# Patient Record
Sex: Female | Born: 1980 | Race: White | Hispanic: No | Marital: Married | Smoking: Never smoker
Health system: Southern US, Community
[De-identification: ages and names within clinical notes are randomized; demographics above are authoritative.]

## PROBLEM LIST (undated history)

## (undated) DIAGNOSIS — B951 Streptococcus, group B, as the cause of diseases classified elsewhere: Secondary | ICD-10-CM

## (undated) DIAGNOSIS — O98819 Other maternal infectious and parasitic diseases complicating pregnancy, unspecified trimester: Secondary | ICD-10-CM

## (undated) DIAGNOSIS — Z789 Other specified health status: Secondary | ICD-10-CM

## (undated) DIAGNOSIS — O24419 Gestational diabetes mellitus in pregnancy, unspecified control: Secondary | ICD-10-CM

## (undated) HISTORY — DX: Other specified health status: Z78.9

## (undated) HISTORY — DX: Gestational diabetes mellitus in pregnancy, unspecified control: O24.419

## (undated) HISTORY — DX: Other maternal infectious and parasitic diseases complicating pregnancy, unspecified trimester: O98.819

## (undated) HISTORY — DX: Streptococcus, group b, as the cause of diseases classified elsewhere: B95.1

---

## 2014-04-23 DIAGNOSIS — R7612 Nonspecific reaction to cell mediated immunity measurement of gamma interferon antigen response without active tuberculosis: Secondary | ICD-10-CM

## 2014-04-23 HISTORY — DX: Nonspecific reaction to cell mediated immunity measurement of gamma interferon antigen response without active tuberculosis: R76.12

## 2015-02-02 ENCOUNTER — Encounter (HOSPITAL_BASED_OUTPATIENT_CLINIC_OR_DEPARTMENT_OTHER): Payer: Self-pay

## 2015-02-02 ENCOUNTER — Encounter (INDEPENDENT_AMBULATORY_CARE_PROVIDER_SITE_OTHER): Payer: Self-pay

## 2015-02-02 LAB — QUANTIFERON(R)-TB GOLD ITM (HLAB): Quantiferon: POSITIVE

## 2015-02-09 ENCOUNTER — Encounter (INDEPENDENT_AMBULATORY_CARE_PROVIDER_SITE_OTHER): Payer: Self-pay | Admitting: Obstetrics & Gynecology

## 2015-02-09 ENCOUNTER — Ambulatory Visit: Payer: Self-pay | Attending: Obstetrics & Gynecology

## 2015-02-21 ENCOUNTER — Encounter (INDEPENDENT_AMBULATORY_CARE_PROVIDER_SITE_OTHER): Payer: Self-pay

## 2015-02-21 DIAGNOSIS — R7612 Nonspecific reaction to cell mediated immunity measurement of gamma interferon antigen response without active tuberculosis: Secondary | ICD-10-CM | POA: Insufficient documentation

## 2015-02-22 ENCOUNTER — Ambulatory Visit (INDEPENDENT_AMBULATORY_CARE_PROVIDER_SITE_OTHER): Payer: Self-pay

## 2015-02-22 ENCOUNTER — Encounter (INDEPENDENT_AMBULATORY_CARE_PROVIDER_SITE_OTHER): Payer: Self-pay

## 2015-02-22 NOTE — Progress Notes (Signed)
Pt seemed unaware needs additional tb testing (3 sputum tests at [facility] for + QFT, +CXR).  Given [facility] tel #; asked her to call for appointment.  Explained needs 3 'cough tests', to check tb further.  Today's intake rescheduled to 11-9; pt too late to be seen this am.

## 2015-03-02 ENCOUNTER — Encounter (INDEPENDENT_AMBULATORY_CARE_PROVIDER_SITE_OTHER): Payer: Self-pay

## 2015-03-02 ENCOUNTER — Ambulatory Visit: Payer: Self-pay | Attending: Obstetrics & Gynecology

## 2015-03-02 VITALS — BP 117/56 | Ht 63.74 in | Wt 169.8 lb

## 2015-03-02 DIAGNOSIS — Z3400 Encounter for supervision of normal first pregnancy, unspecified trimester: Secondary | ICD-10-CM | POA: Insufficient documentation

## 2015-03-02 LAB — CBC
Hematocrit: 38 % (ref 37.0–47.0)
Hgb: 13 g/dL (ref 12.0–16.0)
MCH: 29.8 pg (ref 28.0–32.0)
MCHC: 34.2 g/dL (ref 32.0–36.0)
MCV: 87.2 fL (ref 80.0–100.0)
MPV: 11.5 fL (ref 9.4–12.3)
Nucleated RBC: 0 /100 WBC (ref 0–1)
Platelets: 268 10*3/uL (ref 140–400)
RBC: 4.36 10*6/uL (ref 4.20–5.40)
RDW: 14 % (ref 12–15)
WBC: 6.19 10*3/uL (ref 3.50–10.80)

## 2015-03-02 LAB — HEMOLYSIS INDEX: Hemolysis Index: 1 (ref 0–18)

## 2015-03-02 LAB — HEPATITIS B SURFACE ANTIGEN W/ REFLEX TO CONFIRMATION: Hepatitis B Surface Antigen: NONREACTIVE

## 2015-03-02 LAB — PRENATAL  WORKUP
AB Screen Gel: NEGATIVE
ABO Rh: A POS

## 2015-03-02 LAB — HIV AG/AB 4TH GENERATION: HIV Ag/Ab, 4th Generation: NONREACTIVE

## 2015-03-02 NOTE — Progress Notes (Signed)
Fluent English  Accepts quad.  Sono at 20 wk (12-6) w same day nob.  No flu shot yet; to go again to [facility] 11-10 for sputum specimens.  No zika risk: in [place] > 12 wks ago (returned 12-01-14).  Pregnancy handbook , community resources packet, schedule of classes, female provider availability letter all  given and discussed with  patient.Teaching CHECKLIST addressed  in EPIC.  Breastfeeding FACTS discussed .Social History completed. Marland Kitchen Appointment card details discussed with patient. Patient verbalized understanding of teaching materials.  Last PAP-  0  QFT - +: sputum testing in process.

## 2015-03-03 ENCOUNTER — Encounter (INDEPENDENT_AMBULATORY_CARE_PROVIDER_SITE_OTHER): Payer: Self-pay | Admitting: Obstetrics & Gynecology

## 2015-03-03 LAB — RPR: RPR: NONREACTIVE

## 2015-03-04 LAB — HEMOGLOBINOPATHY EVALUATION W/O HEMOGRAM
Hemoglobin A2: 2.9 % (ref 1.8–3.5)
Hemoglobin A: 97.1 % (ref >96.0)
Hemoglobin F: 0 % (ref <2.0)

## 2015-03-04 LAB — QUAD SCREEN M
Age Risk Down Syndrome: 1:493 {titer}
Estriol, MoM: 0.96
Estriol, Unconjugated: 0.81 ng/mL
Gestational Age:: 16.3
Inhibin MOM: 0.6
Inhibin: 94 pg/mL
MSAFP MOM: 0.78
MSAFP: 27.9 ng/mL
MSS Down Syndrome Risk: <1:5000 {titer}
MSS Trisomy 18 Risk: <1:5000 {titer}
Number of Fetuses:: 1
Risk for ONTD: <1:5000 {titer}
WGHT: 169
hCG MOM: 0.7
hCG: 26

## 2015-03-04 LAB — GONOCOCCUS CULTURE
Chlamydia trachomatis Culture: NEGATIVE
Culture Gonorrhoeae: NEGATIVE

## 2015-03-04 LAB — RUBELLA ANTIBODY, IGG: Rubella AB, IgG: 2.61

## 2015-03-04 LAB — RPR: RPR: NONREACTIVE

## 2015-03-09 ENCOUNTER — Encounter (INDEPENDENT_AMBULATORY_CARE_PROVIDER_SITE_OTHER): Payer: Self-pay | Admitting: Obstetrics & Gynecology

## 2015-03-15 ENCOUNTER — Encounter (INDEPENDENT_AMBULATORY_CARE_PROVIDER_SITE_OTHER): Payer: Self-pay | Admitting: Obstetrics & Gynecology

## 2015-03-29 ENCOUNTER — Other Ambulatory Visit (INDEPENDENT_AMBULATORY_CARE_PROVIDER_SITE_OTHER): Payer: Self-pay

## 2015-03-29 ENCOUNTER — Ambulatory Visit: Payer: Self-pay | Attending: Obstetrics & Gynecology | Admitting: Nurse Practitioner

## 2015-03-29 ENCOUNTER — Encounter (INDEPENDENT_AMBULATORY_CARE_PROVIDER_SITE_OTHER): Payer: Self-pay | Admitting: Nurse Practitioner

## 2015-03-29 ENCOUNTER — Ambulatory Visit
Admission: RE | Admit: 2015-03-29 | Discharge: 2015-03-29 | Disposition: A | Discharge status: Home / Self Care | Payer: Charity | Source: Ambulatory Visit | Attending: Obstetrics & Gynecology | Admitting: Obstetrics & Gynecology

## 2015-03-29 DIAGNOSIS — Z3402 Encounter for supervision of normal first pregnancy, second trimester: Secondary | ICD-10-CM | POA: Insufficient documentation

## 2015-03-29 DIAGNOSIS — Z3400 Encounter for supervision of normal first pregnancy, unspecified trimester: Secondary | ICD-10-CM | POA: Insufficient documentation

## 2015-03-29 DIAGNOSIS — Z3A20 20 weeks gestation of pregnancy: Secondary | ICD-10-CM | POA: Insufficient documentation

## 2015-03-29 LAB — POCT PROTEIN, URINE, QUALITATIVE, DIPSTICK: POCT Protein, UA: NEGATIVE mg/dL

## 2015-03-29 LAB — POCT GLUCOSE, URINE, QUALITATIVE, DIPSTICK: Glucose, UA: NEGATIVE

## 2015-03-29 NOTE — Progress Notes (Signed)
NOB. First visit in this pregnancy.  EDD by certain LMP  QFT+, CXR+, sputa smears neg, cultures pending  Sono - heart views limited, ?EIF, anatomy otherwise wnl. Sono ordered to f/u heart views.  Quad neg  Pap collected  U/c collected  NOB labs wnl including GC/CTM

## 2015-03-31 LAB — PAP SMEAR, SUREPATH WITH HR HPV: HPV DNA, high risk: NOT DETECTED

## 2015-04-08 ENCOUNTER — Other Ambulatory Visit (INDEPENDENT_AMBULATORY_CARE_PROVIDER_SITE_OTHER): Payer: Charity

## 2015-04-08 ENCOUNTER — Ambulatory Visit
Admission: RE | Admit: 2015-04-08 | Discharge: 2015-04-08 | Disposition: A | Discharge status: Home / Self Care | Payer: Charity | Source: Ambulatory Visit | Attending: Nurse Practitioner | Admitting: Nurse Practitioner

## 2015-04-08 DIAGNOSIS — O283 Abnormal ultrasonic finding on antenatal screening of mother: Secondary | ICD-10-CM | POA: Insufficient documentation

## 2015-04-11 ENCOUNTER — Encounter (INDEPENDENT_AMBULATORY_CARE_PROVIDER_SITE_OTHER): Payer: Self-pay | Admitting: Obstetrics & Gynecology

## 2015-04-11 DIAGNOSIS — IMO0002 Reserved for concepts with insufficient information to code with codable children: Secondary | ICD-10-CM | POA: Insufficient documentation

## 2015-04-26 ENCOUNTER — Ambulatory Visit: Payer: Self-pay | Attending: Obstetrics & Gynecology | Admitting: Nurse Practitioner

## 2015-04-26 DIAGNOSIS — Z3400 Encounter for supervision of normal first pregnancy, unspecified trimester: Secondary | ICD-10-CM

## 2015-04-26 LAB — POCT GLUCOSE, URINE, QUALITATIVE, DIPSTICK: Glucose, UA: NEGATIVE

## 2015-04-26 LAB — POCT PROTEIN, URINE, QUALITATIVE, DIPSTICK: POCT Protein, UA: NEGATIVE mg/dL

## 2015-04-26 NOTE — Progress Notes (Signed)
Feeling well, baby active  Sono - isolated EIF  Pap NIL  U/c neg

## 2015-05-24 ENCOUNTER — Ambulatory Visit (INDEPENDENT_AMBULATORY_CARE_PROVIDER_SITE_OTHER): Payer: Charity | Admitting: Nurse Practitioner

## 2015-06-03 ENCOUNTER — Ambulatory Visit: Payer: Medicaid Other | Attending: Obstetrics & Gynecology | Admitting: Nurse Practitioner

## 2015-06-03 VITALS — BP 115/56 | Wt 180.3 lb

## 2015-06-03 DIAGNOSIS — O99810 Abnormal glucose complicating pregnancy: Secondary | ICD-10-CM | POA: Insufficient documentation

## 2015-06-03 DIAGNOSIS — Z3009 Encounter for other general counseling and advice on contraception: Secondary | ICD-10-CM

## 2015-06-03 DIAGNOSIS — Z3400 Encounter for supervision of normal first pregnancy, unspecified trimester: Secondary | ICD-10-CM

## 2015-06-03 LAB — GLUCOSE CHALLENGE: Glucose Challenge: 148 mg/dL

## 2015-06-03 LAB — CBC
Hematocrit: 36.1 % — ABNORMAL LOW (ref 37.0–47.0)
Hgb: 11.7 g/dL — ABNORMAL LOW (ref 12.0–16.0)
MCH: 27.6 pg — ABNORMAL LOW (ref 28.0–32.0)
MCHC: 32.4 g/dL (ref 32.0–36.0)
MCV: 85.1 fL (ref 80.0–100.0)
MPV: 10.8 fL (ref 9.4–12.3)
Nucleated RBC: 0 /100 WBC (ref 0–1)
Platelets: 264 10*3/uL (ref 140–400)
RBC: 4.24 10*6/uL (ref 4.20–5.40)
RDW: 14 % (ref 12–15)
WBC: 7.08 10*3/uL (ref 3.50–10.80)

## 2015-06-03 LAB — POCT PROTEIN, URINE, QUALITATIVE, DIPSTICK: POCT Protein, UA: NEGATIVE mg/dL

## 2015-06-03 LAB — POCT GLUCOSE, URINE, QUALITATIVE, DIPSTICK: Glucose, UA: NEGATIVE

## 2015-06-03 NOTE — Progress Notes (Signed)
Feeling well, baby active  BC - declines  28wk labs today  Sputa cultures neg x3

## 2015-06-07 ENCOUNTER — Telehealth (INDEPENDENT_AMBULATORY_CARE_PROVIDER_SITE_OTHER): Payer: Self-pay

## 2015-06-07 DIAGNOSIS — O99810 Abnormal glucose complicating pregnancy: Secondary | ICD-10-CM

## 2015-06-07 NOTE — Telephone Encounter (Signed)
Accepts 3h for Friday 2-17 at 830; order is in.

## 2015-06-07 NOTE — Telephone Encounter (Signed)
-----   Message from Kimber Relic, RN sent at 06/03/2015  4:01 PM EST -----  3h gtt (needs 3h gtt)

## 2015-06-10 ENCOUNTER — Ambulatory Visit (HOSPITAL_BASED_OUTPATIENT_CLINIC_OR_DEPARTMENT_OTHER): Payer: Medicaid Other

## 2015-06-10 DIAGNOSIS — O99810 Abnormal glucose complicating pregnancy: Secondary | ICD-10-CM

## 2015-06-10 DIAGNOSIS — Z3400 Encounter for supervision of normal first pregnancy, unspecified trimester: Secondary | ICD-10-CM

## 2015-06-10 LAB — GLUCOSE TOLERANCE, 3 HOURS
GTT - 1 hr Specimen: 176 mg/dL
GTT - 2 hr Specimen: 150 mg/dL
GTT - 3 hr Specimen: 139 mg/dL
GTT - Fasting Specimen: 77 mg/dL (ref 70–100)

## 2015-06-10 NOTE — Progress Notes (Signed)
Lab collection completed. Well tolerated by patient.April [name]

## 2015-06-24 ENCOUNTER — Ambulatory Visit (INDEPENDENT_AMBULATORY_CARE_PROVIDER_SITE_OTHER): Payer: Charity | Admitting: Nurse Practitioner

## 2015-06-27 ENCOUNTER — Ambulatory Visit (INDEPENDENT_AMBULATORY_CARE_PROVIDER_SITE_OTHER): Payer: Medicaid Other

## 2015-06-27 ENCOUNTER — Ambulatory Visit: Payer: Medicaid Other | Attending: Obstetrics & Gynecology | Admitting: Nurse Practitioner

## 2015-06-27 DIAGNOSIS — O368190 Decreased fetal movements, unspecified trimester, not applicable or unspecified: Secondary | ICD-10-CM

## 2015-06-27 DIAGNOSIS — Z3400 Encounter for supervision of normal first pregnancy, unspecified trimester: Secondary | ICD-10-CM | POA: Insufficient documentation

## 2015-06-27 LAB — POCT GLUCOSE, URINE, QUALITATIVE, DIPSTICK: Glucose, UA: NEGATIVE

## 2015-06-27 LAB — POCT PROTEIN, URINE, QUALITATIVE, DIPSTICK: POCT Protein, UA: NEGATIVE mg/dL

## 2015-06-27 NOTE — Progress Notes (Signed)
Feeling well, but notes that the baby does not move as much as it used to. For NST today.  Offer Tdap at next visit  28wk labs - 3hr GTT wnl

## 2015-07-11 ENCOUNTER — Ambulatory Visit (INDEPENDENT_AMBULATORY_CARE_PROVIDER_SITE_OTHER): Payer: Medicaid Other | Admitting: Nurse Practitioner

## 2015-07-11 DIAGNOSIS — Z3400 Encounter for supervision of normal first pregnancy, unspecified trimester: Secondary | ICD-10-CM

## 2015-07-11 DIAGNOSIS — Z23 Encounter for immunization: Secondary | ICD-10-CM

## 2015-07-11 LAB — POCT GLUCOSE, URINE, QUALITATIVE, DIPSTICK: Glucose, UA: NEGATIVE

## 2015-07-11 LAB — GROUP B STREP TRANSCRIBED: GBS Transcribed: POSITIVE

## 2015-07-11 LAB — POCT PROTEIN, URINE, QUALITATIVE, DIPSTICK: POCT Protein, UA: NEGATIVE mg/dL

## 2015-07-11 NOTE — Progress Notes (Signed)
Feeling well, baby moving well now  NST was reactive after last visit  Tdap today  GBS collected

## 2015-07-11 NOTE — Progress Notes (Signed)
Administered TDAP, supplied by [facility] at no charge.  Reinforced value of breastmilk in preventing whooping cough.

## 2015-07-14 ENCOUNTER — Encounter (INDEPENDENT_AMBULATORY_CARE_PROVIDER_SITE_OTHER): Payer: Self-pay

## 2015-07-14 ENCOUNTER — Encounter (INDEPENDENT_AMBULATORY_CARE_PROVIDER_SITE_OTHER): Payer: Self-pay | Admitting: Obstetrics & Gynecology

## 2015-07-14 DIAGNOSIS — O99820 Streptococcus B carrier state complicating pregnancy: Secondary | ICD-10-CM | POA: Insufficient documentation

## 2015-07-20 ENCOUNTER — Ambulatory Visit (INDEPENDENT_AMBULATORY_CARE_PROVIDER_SITE_OTHER): Payer: Medicaid Other | Admitting: Nurse Practitioner

## 2015-07-20 DIAGNOSIS — Z3400 Encounter for supervision of normal first pregnancy, unspecified trimester: Secondary | ICD-10-CM

## 2015-07-20 LAB — POCT PROTEIN, URINE, QUALITATIVE, DIPSTICK: POCT Protein, UA: NEGATIVE mg/dL

## 2015-07-20 LAB — POCT GLUCOSE, URINE, QUALITATIVE, DIPSTICK: Glucose, UA: NEGATIVE

## 2015-07-20 NOTE — Progress Notes (Signed)
Feeling well, baby active  GBS+: will treat in labor  Reviewed when to seek care, signs of labor, leakage of fluid, headache, decreased fetal movement

## 2015-07-20 NOTE — Progress Notes (Signed)
Patient seen with Darleen Crocker, NP student. Agree with her documentation below.

## 2015-07-28 ENCOUNTER — Ambulatory Visit: Payer: Self-pay | Attending: Obstetrics & Gynecology | Admitting: Family

## 2015-07-28 ENCOUNTER — Encounter (INDEPENDENT_AMBULATORY_CARE_PROVIDER_SITE_OTHER): Payer: Self-pay | Admitting: Obstetrics & Gynecology

## 2015-07-28 VITALS — BP 119/61 | HR 79 | Wt 193.0 lb

## 2015-07-28 DIAGNOSIS — Z3009 Encounter for other general counseling and advice on contraception: Secondary | ICD-10-CM

## 2015-07-28 DIAGNOSIS — Z3400 Encounter for supervision of normal first pregnancy, unspecified trimester: Secondary | ICD-10-CM

## 2015-07-28 LAB — POCT GLUCOSE, URINE, QUALITATIVE, DIPSTICK: Glucose, UA: NEGATIVE

## 2015-07-28 LAB — POCT PROTEIN, URINE, QUALITATIVE, DIPSTICK: POCT Protein, UA: NEGATIVE mg/dL

## 2015-07-28 NOTE — Progress Notes (Signed)
No major complaints   already has Medicaid, she says

## 2015-08-05 ENCOUNTER — Ambulatory Visit (INDEPENDENT_AMBULATORY_CARE_PROVIDER_SITE_OTHER): Payer: Self-pay | Admitting: Family

## 2015-08-05 VITALS — BP 120/61 | HR 76 | Wt 192.8 lb

## 2015-08-05 DIAGNOSIS — R7612 Nonspecific reaction to cell mediated immunity measurement of gamma interferon antigen response without active tuberculosis: Secondary | ICD-10-CM

## 2015-08-05 DIAGNOSIS — Z3400 Encounter for supervision of normal first pregnancy, unspecified trimester: Secondary | ICD-10-CM

## 2015-08-05 DIAGNOSIS — Z3009 Encounter for other general counseling and advice on contraception: Secondary | ICD-10-CM

## 2015-08-05 DIAGNOSIS — O99820 Streptococcus B carrier state complicating pregnancy: Secondary | ICD-10-CM

## 2015-08-05 LAB — POCT GLUCOSE, URINE, QUALITATIVE, DIPSTICK: Glucose, UA: NEGATIVE

## 2015-08-05 LAB — POCT PROTEIN, URINE, QUALITATIVE, DIPSTICK: POCT Protein, UA: NEGATIVE mg/dL

## 2015-08-05 NOTE — Progress Notes (Signed)
Feeling well, fetus active  GBS +- treat in labor  Reviewed warning sx, urgent care, PTL, PIH, labor sx, kick counts

## 2015-08-12 ENCOUNTER — Ambulatory Visit (INDEPENDENT_AMBULATORY_CARE_PROVIDER_SITE_OTHER): Payer: Self-pay | Admitting: Nurse Practitioner

## 2015-08-12 DIAGNOSIS — Z3400 Encounter for supervision of normal first pregnancy, unspecified trimester: Secondary | ICD-10-CM

## 2015-08-12 LAB — POCT PROTEIN, URINE, QUALITATIVE, DIPSTICK: POCT Protein, UA: NEGATIVE mg/dL

## 2015-08-12 LAB — POCT GLUCOSE, URINE, QUALITATIVE, DIPSTICK: Glucose, UA: NEGATIVE

## 2015-08-12 NOTE — Progress Notes (Signed)
Feeling well, baby active  Reviewed when to call for sx labor, PIH, urgent care, decreased fetal movement.

## 2015-08-19 ENCOUNTER — Ambulatory Visit (INDEPENDENT_AMBULATORY_CARE_PROVIDER_SITE_OTHER): Payer: Charity | Admitting: Obstetrics

## 2015-08-23 ENCOUNTER — Inpatient Hospital Stay (HOSPITAL_BASED_OUTPATIENT_CLINIC_OR_DEPARTMENT_OTHER): Payer: Medicaid Other

## 2015-08-23 ENCOUNTER — Observation Stay (HOSPITAL_BASED_OUTPATIENT_CLINIC_OR_DEPARTMENT_OTHER): Payer: Medicaid Other | Admitting: Anesthesiology

## 2015-08-23 ENCOUNTER — Inpatient Hospital Stay
Admission: AD | Admit: 2015-08-23 | Discharge: 2015-08-27 | DRG: 540 | Disposition: A | Discharge status: Home / Self Care | Payer: Medicaid Other | Source: Ambulatory Visit | Attending: Obstetrics & Gynecology | Admitting: Obstetrics & Gynecology

## 2015-08-23 ENCOUNTER — Ambulatory Visit: Payer: Self-pay | Attending: Obstetrics & Gynecology | Admitting: Obstetrics

## 2015-08-23 ENCOUNTER — Encounter (HOSPITAL_BASED_OUTPATIENT_CLINIC_OR_DEPARTMENT_OTHER): Payer: Self-pay

## 2015-08-23 DIAGNOSIS — Z3A41 41 weeks gestation of pregnancy: Secondary | ICD-10-CM

## 2015-08-23 DIAGNOSIS — D259 Leiomyoma of uterus, unspecified: Secondary | ICD-10-CM | POA: Diagnosis present

## 2015-08-23 DIAGNOSIS — Z3400 Encounter for supervision of normal first pregnancy, unspecified trimester: Secondary | ICD-10-CM

## 2015-08-23 DIAGNOSIS — Z3490 Encounter for supervision of normal pregnancy, unspecified, unspecified trimester: Secondary | ICD-10-CM

## 2015-08-23 DIAGNOSIS — O99824 Streptococcus B carrier state complicating childbirth: Secondary | ICD-10-CM | POA: Diagnosis present

## 2015-08-23 DIAGNOSIS — O134 Gestational [pregnancy-induced] hypertension without significant proteinuria, complicating childbirth: Secondary | ICD-10-CM | POA: Diagnosis present

## 2015-08-23 DIAGNOSIS — O324XX0 Maternal care for high head at term, not applicable or unspecified: Principal | ICD-10-CM | POA: Diagnosis present

## 2015-08-23 DIAGNOSIS — O3413 Maternal care for benign tumor of corpus uteri, third trimester: Secondary | ICD-10-CM | POA: Diagnosis present

## 2015-08-23 LAB — CBC
Hematocrit: 37.6 % (ref 37.0–47.0)
Hgb: 12.5 g/dL (ref 12.0–16.0)
MCH: 27.6 pg — ABNORMAL LOW (ref 28.0–32.0)
MCHC: 33.2 g/dL (ref 32.0–36.0)
MCV: 83 fL (ref 80.0–100.0)
MPV: 11.4 fL (ref 9.4–12.3)
Nucleated RBC: 0 /100 WBC (ref 0–1)
Platelets: 261 10*3/uL (ref 140–400)
RBC: 4.53 10*6/uL (ref 4.20–5.40)
RDW: 16 % — ABNORMAL HIGH (ref 12–15)
WBC: 7.69 10*3/uL (ref 3.50–10.80)

## 2015-08-23 LAB — GFR: EGFR: >60

## 2015-08-23 LAB — TYPE AND SCREEN
AB Screen Gel: NEGATIVE
ABO Rh: A POS

## 2015-08-23 LAB — URIC ACID: Uric acid: 2.9 mg/dL (ref 2.6–6.0)

## 2015-08-23 LAB — CREATININE, SERUM: Creatinine: 0.6 mg/dL (ref 0.6–1.0)

## 2015-08-23 LAB — AST: AST (SGOT): 11 U/L (ref 5–34)

## 2015-08-23 LAB — POCT PROTEIN, URINE, QUALITATIVE, DIPSTICK

## 2015-08-23 LAB — POCT GLUCOSE, URINE, QUALITATIVE, DIPSTICK: Glucose, UA: NEGATIVE

## 2015-08-23 LAB — LACTATE DEHYDROGENASE: LDH: 170 U/L (ref 125–331)

## 2015-08-23 LAB — ALT: ALT: 13 U/L (ref 0–55)

## 2015-08-23 MED ORDER — FENTANYL 2 MCG/ML+BUPIVACAINE 0.125% 100 ML
EPIDURAL | Status: DC
Start: 2015-08-23 — End: 2015-08-25
  Administered 2015-08-24 (×4): 100 mL via EPIDURAL
  Filled 2015-08-23 (×3): qty 100

## 2015-08-23 MED ORDER — EPHEDRINE SULFATE 50 MG/ML IJ SOLN
10.0000 mg | Freq: Once | INTRAMUSCULAR | Status: DC | PRN
Start: 2015-08-23 — End: 2015-08-24

## 2015-08-23 MED ORDER — FAMOTIDINE 20 MG PO TABS
20.0000 mg | ORAL_TABLET | Freq: Two times a day (BID) | ORAL | Status: DC | PRN
Start: 2015-08-23 — End: 2015-08-24

## 2015-08-23 MED ORDER — LACTATED RINGERS IV BOLUS
1000.0000 mL | Freq: Once | INTRAVENOUS | Status: DC
Start: 2015-08-23 — End: 2015-08-24

## 2015-08-23 MED ORDER — TERBUTALINE SULFATE 1 MG/ML IJ SOLN
0.2500 mg | Freq: Once | INTRAMUSCULAR | Status: DC | PRN
Start: 2015-08-23 — End: 2015-08-24

## 2015-08-23 MED ORDER — BUPIVACAINE HCL (PF) 0.25 % IJ SOLN
INTRAMUSCULAR | Status: AC
Start: 2015-08-23 — End: 2015-08-23
  Administered 2015-08-23: 20 mL
  Filled 2015-08-23: qty 20

## 2015-08-23 MED ORDER — LACTATED RINGERS IV SOLN
INTRAVENOUS | Status: DC
Start: 2015-08-23 — End: 2015-08-25
  Administered 2015-08-24: 200 mL via INTRAVENOUS

## 2015-08-23 MED ORDER — OXYTOCIN-LACTATED RINGERS 30 UNIT/500ML IV SOLN
2.0000 m[IU]/min | INTRAVENOUS | Status: DC
Start: 2015-08-23 — End: 2015-08-25

## 2015-08-23 MED ORDER — PENICILLIN 5 MU IN NORMAL SALINE 100 ML (CNR)
5.0000 10*6.[IU] | Freq: Once | Status: AC
Start: 2015-08-23 — End: 2015-08-23
  Administered 2015-08-23: 5 10*6.[IU] via INTRAVENOUS
  Filled 2015-08-23: qty 100

## 2015-08-23 MED ORDER — ONDANSETRON 8 MG PO TBDP
8.0000 mg | ORAL_TABLET | Freq: Three times a day (TID) | ORAL | Status: DC | PRN
Start: 2015-08-23 — End: 2015-08-24

## 2015-08-23 MED ORDER — PENICILLIN 2.5 MU IN NORMAL SALINE 100 ML (CNR)
2.5000 10*6.[IU] | Status: DC
Start: 2015-08-24 — End: 2015-08-24
  Administered 2015-08-23 – 2015-08-24 (×5): 2.5 10*6.[IU] via INTRAVENOUS
  Filled 2015-08-23 (×5): qty 100

## 2015-08-23 MED ORDER — FAMOTIDINE 10 MG/ML IV SOLN (WRAP)
20.0000 mg | Freq: Two times a day (BID) | INTRAVENOUS | Status: DC | PRN
Start: 2015-08-23 — End: 2015-08-24

## 2015-08-23 MED ORDER — CITRIC ACID-SODIUM CITRATE 334-500 MG/5ML PO SOLN
30.0000 mL | Freq: Once | ORAL | Status: DC | PRN
Start: 2015-08-23 — End: 2015-08-24

## 2015-08-23 MED ORDER — OXYTOCIN-LACTATED RINGERS 30 UNIT/500ML IV SOLN
2.0000 m[IU]/min | INTRAVENOUS | Status: DC
Start: 2015-08-23 — End: 2015-08-25
  Administered 2015-08-24: 6 m[IU]/min via INTRAVENOUS
  Filled 2015-08-23: qty 500

## 2015-08-23 MED ORDER — NALOXONE HCL 0.4 MG/ML IJ SOLN (WRAP)
0.1000 mg | INTRAMUSCULAR | Status: DC | PRN
Start: 2015-08-23 — End: 2015-08-24

## 2015-08-23 MED ORDER — OXYTOCIN-LACTATED RINGERS 30 UNIT/500ML IV SOLN
INTRAVENOUS | Status: AC
Start: 2015-08-23 — End: 2015-08-23
  Administered 2015-08-23: 2 m[IU]/min via INTRAVENOUS
  Filled 2015-08-23: qty 500

## 2015-08-23 MED ORDER — MISOPROSTOL 200 MCG PO TABS
50.0000 ug | ORAL_TABLET | ORAL | Status: DC | PRN
Start: 2015-08-23 — End: 2015-08-27
  Filled 2015-08-23: qty 1

## 2015-08-23 MED ORDER — SODIUM CHLORIDE 0.9 % IV SOLN
INTRAVENOUS | Status: DC | PRN
Start: 2015-08-23 — End: 2015-08-24
  Administered 2015-08-23: 14 mL via EPIDURAL

## 2015-08-23 MED ORDER — ONDANSETRON HCL 4 MG/2ML IJ SOLN
4.0000 mg | Freq: Three times a day (TID) | INTRAMUSCULAR | Status: DC | PRN
Start: 2015-08-23 — End: 2015-08-24

## 2015-08-23 MED ORDER — FENTANYL 2 MCG/ML+BUPIVACAINE 0.125% 100 ML
EPIDURAL | Status: AC
Start: 2015-08-23 — End: 2015-08-24
  Administered 2015-08-23: 10 mL/h via EPIDURAL
  Filled 2015-08-23: qty 100

## 2015-08-23 MED ORDER — METOCLOPRAMIDE HCL 5 MG/ML IJ SOLN
10.0000 mg | Freq: Once | INTRAMUSCULAR | Status: DC | PRN
Start: 2015-08-23 — End: 2015-08-24

## 2015-08-23 MED ORDER — FENTANYL CITRATE (PF) 50 MCG/ML IJ SOLN (WRAP)
INTRAMUSCULAR | Status: AC
Start: 2015-08-23 — End: 2015-08-23
  Administered 2015-08-23: 100 ug
  Filled 2015-08-23: qty 2

## 2015-08-23 NOTE — Anesthesia Preprocedure Evaluation (Signed)
Anesthesia Evaluation    AIRWAY    Mallampati: II    TM distance: >3 FB  Neck ROM: full  Mouth Opening:full   CARDIOVASCULAR    cardiovascular exam normal       DENTAL    no notable dental hx     PULMONARY    pulmonary exam normal     OTHER FINDINGS                      Anesthesia Plan    ASA 2     epidural                     Detailed anesthesia plan: epidural            informed consent obtained      pertinent labs reviewed

## 2015-08-23 NOTE — H&P (Signed)
H&P note    Subj: Pt is a 35 y.o. G1P0 at [redacted]w[redacted]d, EDD 08/15/2015, by Last Menstrual Period c/w 20 week sono presenting for IOL for labor for term.   Pt had one elevated BP in clinic today.  Denise ha, visual changes, ruq pain.  Feeling some ctx  Denies lof, VB    Complications of pregnancy:  GBS pos  Elevated 1 hr, normal 3 hr gtt      OB History   Gravida Para Term Preterm AB SAB TAB Ectopic Multiple Living   1               # Outcome Date GA Lbr Len/2nd Weight Sex Delivery Anes PTL Lv   1 Current                   PMH:   Past Medical History   Diagnosis Date   . 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  Tel # given 02-22-15. dpr   . Group B streptococcal infection in pregnancy      PSH: History reviewed. No pertinent past surgical history.  SHx: neg x3  FHx: History reviewed. No pertinent family history.  Meds:  Prescriptions prior to admission   Medication Sig   . Prenatal Vit-Fe Fumarate-FA (PRENATAL LOW IRON) 27-0.8 MG Tab Take 1 tablet by mouth daily.     All:No Known Allergies      Objective  PE:   Filed Vitals:    08/23/15 1805   BP: 133/84       Gen: NAD  Chest: RRR, breathing comfortably  Abd: s/nt/gravid   Ext: no edema, nttp  VE: 3/80/-2     Vtx, EFW 8 lb by leopolds    FHT: 130/ mod var/ + accels , no decels  TOCO: 7-8 min    PNL:  GBS+   Lab Results   Component Value Date    ABORH A POS 03/02/2015    HEPBSAG Non-Reactive 03/02/2015    GBS Positive 07/11/2015    RPR Nonreactive 03/04/2015    RUBELLAABIGG 2.61 03/02/2015    CTRACHOMATCX Negative 03/04/2015     Lab Results   Component Value Date    WBC 7.08 06/03/2015    HGB 11.7* 06/03/2015    HCT 36.1* 06/03/2015    MCV 85.1 06/03/2015    PLT 264 06/03/2015           Assessment/Plan: Pt is a 35 y.o. G1P0 at [redacted]w[redacted]d EDD 08/15/2015, by Last Menstrual Period  A POS/Rubella Immune/ GBS positive  - FHR cat I  - Admit for indiction  - PIH labs  - Pit for induction  - PCN   - D/w Dr Evette Cristal      Linna Hoff, MD  PGY4

## 2015-08-23 NOTE — Progress Notes (Signed)
Received patient at 1810, VSS and FHR Category I. Hemorrhage risk low. Plan of care explained and all questions answered. Contacted resident and waiting for response.

## 2015-08-23 NOTE — Progress Notes (Addendum)
Labor Check     Pt tolerating contractions well.     Filed Vitals:    08/23/15 2000 08/23/15 2030 08/23/15 2100 08/23/15 2130   BP: 122/73 121/61 123/70 124/71   Pulse: 72 67 68 66   Temp:       TempSrc:       Resp:         EFM: 130, moderate, + accels, no decels  Toco: ctx q20min  SVE: 4/100/-2 AROM, thick meconium at 2245    A/P: 35 y.o. G1P0 @ [redacted]w[redacted]d, in labor  Rh+/RI/GBS +  EFM category 1  Continue current mgmt  Anticipate NSVD    Prince Solian, PGY2

## 2015-08-23 NOTE — Progress Notes (Signed)
Patient send to L&D for induction due to post dates and [facility]  Communication sheet, and instruction handed to pt.  Pt will provide own transportation and mentioned going home to [place] to pick up some belongings before going to the hospital.  Triage charge nurse Jae Dire notified and aware of pt's arrival @ 3:55 pm  Mariana Arn

## 2015-08-23 NOTE — Anesthesia Procedure Notes (Signed)
Epidural  Patient location during procedure: L&D  Reason for block: Labor or C-section  Block at Surgeon's request: Yes      Staffing  Anesthesiologist: Karmen Bongo DAVID  Performed by: anesthesiologist     Pre-procedure Checklist   Completed: patient identified, pre-op evaluation, timeout performed, risks and benefits discussed and anesthesia consent given      Epidural  Patient monitoring: NIBP and Pulse oximetry    Premedication: No and Meaningful Contact Maintained  Patient position: sitting    Sterile Technique: Betadine, Sterile gloves, Mask and Sterile drape  Skin Local: bupivicaine 0.25%  Dose: 1 mL    Attempts  Number of attempts: 1                    Successful attempt  Interspace: L2-3  Approach: midline    Needle type: Touhy needle   Needle gauge: 17  Injection technique: LOR air  Epidural Space ID: 5 cm  CSF Return: No   Blood Return: No  Paresthesia Pain: no and No    Needle Placement  Needle type: Touhy needle   Needle gauge: 17  Injection technique: LOR air  CSF Return: No  Blood Return: No          Paresthesia Pain: no and No    Catheter Placement   Catheter type: end hole  Catheter size: 19 G  Catheter at skin depth: 10 cm  CSF Return: No  Blood Return: No  Test Dose:negative    Incremental injection: yes    No Catheter IV/SA Signs or Symptoms    Assessment   Patient tolerated procedure well: Yes  Block Outcome: patient tolerated procedure well, no complications and pain improved

## 2015-08-23 NOTE — Progress Notes (Signed)
Pt well, missed appt 4/28 bc flat tire  New elevated BP today, denies PEC sx  Send to [facility] for IOL

## 2015-08-24 ENCOUNTER — Encounter (HOSPITAL_BASED_OUTPATIENT_CLINIC_OR_DEPARTMENT_OTHER): Payer: Self-pay

## 2015-08-24 ENCOUNTER — Encounter (HOSPITAL_BASED_OUTPATIENT_CLINIC_OR_DEPARTMENT_OTHER): Admission: AD | Disposition: A | Discharge status: Home / Self Care | Payer: Self-pay | Source: Ambulatory Visit

## 2015-08-24 DIAGNOSIS — O480 Post-term pregnancy: Secondary | ICD-10-CM

## 2015-08-24 DIAGNOSIS — Z3A41 41 weeks gestation of pregnancy: Secondary | ICD-10-CM

## 2015-08-24 SURGERY — Surgical Case
Anesthesia: Regional | Site: Abdomen | Wound class: Clean Contaminated

## 2015-08-24 MED ORDER — HYDROMORPHONE HCL 1 MG/ML IJ SOLN
0.5000 mg | INTRAMUSCULAR | Status: DC | PRN
Start: 2015-08-24 — End: 2015-08-27

## 2015-08-24 MED ORDER — LANOLIN EX OINT
TOPICAL_OINTMENT | CUTANEOUS | Status: DC | PRN
Start: 2015-08-24 — End: 2015-08-27

## 2015-08-24 MED ORDER — OXYTOCIN 10 UNIT/ML IJ SOLN
INTRAMUSCULAR | Status: AC
Start: 2015-08-24 — End: ?
  Filled 2015-08-24: qty 4

## 2015-08-24 MED ORDER — ONDANSETRON HCL 4 MG/2ML IJ SOLN
INTRAMUSCULAR | Status: DC | PRN
Start: 2015-08-24 — End: 2015-08-24
  Administered 2015-08-24: 4 mg via INTRAVENOUS

## 2015-08-24 MED ORDER — KETOROLAC TROMETHAMINE 30 MG/ML IJ SOLN
30.0000 mg | Freq: Once | INTRAMUSCULAR | Status: AC | PRN
Start: 2015-08-24 — End: 2015-08-25

## 2015-08-24 MED ORDER — ACETAMINOPHEN 500 MG PO TABS
1000.0000 mg | ORAL_TABLET | Freq: Four times a day (QID) | ORAL | Status: DC | PRN
Start: 2015-08-24 — End: 2015-08-25

## 2015-08-24 MED ORDER — DEXTROSE-SODIUM CHLORIDE 5-0.9 % IV SOLN
INTRAVENOUS | Status: AC
Start: 2015-08-24 — End: 2015-08-24

## 2015-08-24 MED ORDER — OXYCODONE-ACETAMINOPHEN 5-325 MG PO TABS
ORAL_TABLET | ORAL | Status: AC
Start: 2015-08-24 — End: ?
  Filled 2015-08-24: qty 1

## 2015-08-24 MED ORDER — ONDANSETRON HCL 4 MG/2ML IJ SOLN
4.0000 mg | Freq: Four times a day (QID) | INTRAMUSCULAR | Status: DC | PRN
Start: 2015-08-24 — End: 2015-08-24

## 2015-08-24 MED ORDER — CEFAZOLIN SODIUM-DEXTROSE 2-3 GM-% IV SOLR
2.0000 g | Freq: Once | INTRAVENOUS | Status: AC
Start: 2015-08-24 — End: 2015-08-24
  Administered 2015-08-24: 2 g via INTRAVENOUS

## 2015-08-24 MED ORDER — PHENYLEPHRINE 100 MCG/ML IN NACL 0.9% IV SOSY
PREFILLED_SYRINGE | INTRAVENOUS | Status: AC
Start: 2015-08-24 — End: ?
  Filled 2015-08-24: qty 5

## 2015-08-24 MED ORDER — DOCUSATE SODIUM 100 MG PO CAPS
200.0000 mg | ORAL_CAPSULE | Freq: Two times a day (BID) | ORAL | Status: DC | PRN
Start: 2015-08-24 — End: 2015-08-27
  Administered 2015-08-25 – 2015-08-27 (×5): 200 mg via ORAL
  Filled 2015-08-24 (×5): qty 2

## 2015-08-24 MED ORDER — LACTATED RINGERS IV SOLN
INTRAVENOUS | Status: DC
Start: 2015-08-24 — End: 2015-08-25

## 2015-08-24 MED ORDER — OXYTOCIN 10 UNIT/ML IJ SOLN
INTRAMUSCULAR | Status: DC | PRN
Start: 2015-08-24 — End: 2015-08-24
  Administered 2015-08-24 (×2): 20 [IU] via INTRAVENOUS

## 2015-08-24 MED ORDER — LACTATED RINGERS IV BOLUS
1000.0000 mL | Freq: Once | INTRAVENOUS | Status: DC
Start: 2015-08-24 — End: 2015-08-24

## 2015-08-24 MED ORDER — TETANUS-DIPHTH-ACELL PERTUSSIS 5-2.5-18.5 LF-MCG/0.5 IM SUSP
0.5000 mL | INTRAMUSCULAR | Status: DC | PRN
Start: 2015-08-24 — End: 2015-08-25

## 2015-08-24 MED ORDER — NALBUPHINE HCL 10 MG/ML IJ SOLN
5.0000 mg | INTRAMUSCULAR | Status: DC | PRN
Start: 2015-08-24 — End: 2015-08-27

## 2015-08-24 MED ORDER — METHYLERGONOVINE MALEATE 0.2 MG/ML IJ SOLN
200.0000 ug | INTRAMUSCULAR | Status: DC | PRN
Start: 2015-08-24 — End: 2015-08-27

## 2015-08-24 MED ORDER — OXYTOCIN-LACTATED RINGERS 30 UNIT/500ML IV SOLN
INTRAVENOUS | Status: AC
Start: 2015-08-24 — End: ?
  Filled 2015-08-24: qty 500

## 2015-08-24 MED ORDER — LIDOCAINE-EPINEPHRINE 2 %-1:200000 IJ SOLN
INTRAMUSCULAR | Status: DC | PRN
Start: 2015-08-24 — End: 2015-08-24
  Administered 2015-08-24: 2 mL
  Administered 2015-08-24: 5 mL
  Administered 2015-08-24: 10 mL

## 2015-08-24 MED ORDER — ONDANSETRON HCL 4 MG/2ML IJ SOLN
4.0000 mg | Freq: Four times a day (QID) | INTRAMUSCULAR | Status: DC | PRN
Start: 2015-08-24 — End: 2015-08-27

## 2015-08-24 MED ORDER — METHYLERGONOVINE MALEATE 0.2 MG/ML IJ SOLN
INTRAMUSCULAR | Status: DC
Start: 2015-08-24 — End: 2015-08-24
  Filled 2015-08-24: qty 1

## 2015-08-24 MED ORDER — ACETAMINOPHEN 500 MG PO TABS
ORAL_TABLET | ORAL | Status: AC
Start: 2015-08-24 — End: 2015-08-24
  Administered 2015-08-24: 1000 mg via ORAL
  Filled 2015-08-24: qty 2

## 2015-08-24 MED ORDER — SODIUM CHLORIDE 0.9 % IJ SOLN
3.0000 mL | Freq: Three times a day (TID) | INTRAMUSCULAR | Status: DC
Start: 2015-08-26 — End: 2015-08-27

## 2015-08-24 MED ORDER — LACTATED RINGERS IV SOLN
125.0000 mL/h | INTRAVENOUS | Status: AC
Start: 2015-08-25 — End: 2015-08-26
  Administered 2015-08-25: 125 mL/h via INTRAVENOUS

## 2015-08-24 MED ORDER — BISACODYL 10 MG RE SUPP
10.0000 mg | Freq: Every day | RECTAL | Status: DC | PRN
Start: 2015-08-24 — End: 2015-08-27

## 2015-08-24 MED ORDER — PHENYLEPHRINE HCL 10 MG/ML IV SOLN (WRAP)
Status: DC | PRN
Start: 2015-08-24 — End: 2015-08-24
  Administered 2015-08-24 (×5): 100 ug via INTRAVENOUS

## 2015-08-24 MED ORDER — METHYLERGONOVINE MALEATE 0.2 MG/ML IJ SOLN
200.0000 ug | Freq: Once | INTRAMUSCULAR | Status: DC | PRN
Start: 2015-08-24 — End: 2015-08-27

## 2015-08-24 MED ORDER — PRENATAL AD PO TABS
1.0000 | ORAL_TABLET | Freq: Every day | ORAL | Status: DC
Start: 2015-08-25 — End: 2015-08-27
  Administered 2015-08-25 – 2015-08-27 (×3): 1 via ORAL
  Filled 2015-08-24 (×3): qty 1

## 2015-08-24 MED ORDER — IBUPROFEN 600 MG PO TABS
600.0000 mg | ORAL_TABLET | Freq: Four times a day (QID) | ORAL | Status: DC
Start: 2015-08-24 — End: 2015-08-25
  Administered 2015-08-24: 600 mg via ORAL

## 2015-08-24 MED ORDER — ACETAMINOPHEN 650 MG RE SUPP
650.0000 mg | Freq: Four times a day (QID) | RECTAL | Status: DC | PRN
Start: 2015-08-24 — End: 2015-08-27

## 2015-08-24 MED ORDER — ONDANSETRON HCL 4 MG/2ML IJ SOLN
4.0000 mg | Freq: Once | INTRAMUSCULAR | Status: DC | PRN
Start: 2015-08-24 — End: 2015-08-25

## 2015-08-24 MED ORDER — IBUPROFEN 600 MG PO TABS
600.0000 mg | ORAL_TABLET | Freq: Four times a day (QID) | ORAL | Status: DC
Start: 2015-08-25 — End: 2015-08-27
  Administered 2015-08-25 – 2015-08-27 (×9): 600 mg via ORAL
  Filled 2015-08-24 (×9): qty 1

## 2015-08-24 MED ORDER — CITRIC ACID-SODIUM CITRATE 334-500 MG/5ML PO SOLN
30.0000 mL | Freq: Once | ORAL | Status: DC | PRN
Start: 2015-08-24 — End: 2015-08-24

## 2015-08-24 MED ORDER — MISOPROSTOL 200 MCG PO TABS
ORAL_TABLET | ORAL | Status: DC
Start: 2015-08-24 — End: 2015-08-24
  Filled 2015-08-24: qty 5

## 2015-08-24 MED ORDER — OXYCODONE-ACETAMINOPHEN 5-325 MG PO TABS
1.0000 | ORAL_TABLET | ORAL | Status: DC | PRN
Start: 2015-08-24 — End: 2015-08-25
  Administered 2015-08-24: 1 via ORAL

## 2015-08-24 MED ORDER — ONDANSETRON 4 MG PO TBDP
4.0000 mg | ORAL_TABLET | Freq: Four times a day (QID) | ORAL | Status: DC | PRN
Start: 2015-08-24 — End: 2015-08-24

## 2015-08-24 MED ORDER — SIMETHICONE 80 MG PO CHEW
160.0000 mg | CHEWABLE_TABLET | Freq: Three times a day (TID) | ORAL | Status: DC | PRN
Start: 2015-08-24 — End: 2015-08-27
  Administered 2015-08-26: 160 mg via ORAL
  Filled 2015-08-24: qty 2

## 2015-08-24 MED ORDER — NALOXONE HCL 0.4 MG/ML IJ SOLN (WRAP)
0.1000 mg | INTRAMUSCULAR | Status: DC | PRN
Start: 2015-08-24 — End: 2015-08-27

## 2015-08-24 MED ORDER — MORPHINE SULFATE (PF) 1 MG/ML IJ SOLN
INTRAMUSCULAR | Status: AC
Start: 2015-08-24 — End: ?
  Filled 2015-08-24: qty 10

## 2015-08-24 MED ORDER — MISOPROSTOL 200 MCG PO TABS
800.0000 ug | ORAL_TABLET | Freq: Once | ORAL | Status: DC | PRN
Start: 2015-08-24 — End: 2015-08-27

## 2015-08-24 MED ORDER — CEFAZOLIN SODIUM 1 G IJ SOLR
INTRAMUSCULAR | Status: AC
Start: 2015-08-24 — End: ?
  Filled 2015-08-24: qty 2000

## 2015-08-24 MED ORDER — ONDANSETRON 4 MG PO TBDP
4.0000 mg | ORAL_TABLET | Freq: Four times a day (QID) | ORAL | Status: DC | PRN
Start: 2015-08-24 — End: 2015-08-27

## 2015-08-24 MED ORDER — LACTATED RINGERS IV SOLN
INTRAVENOUS | Status: DC | PRN
Start: 2015-08-24 — End: 2015-08-24

## 2015-08-24 MED ORDER — MEPERIDINE HCL 25 MG/ML IJ SOLN
12.5000 mg | INTRAMUSCULAR | Status: DC | PRN
Start: 2015-08-24 — End: 2015-08-27

## 2015-08-24 MED ORDER — DEXTROSE 5% IV BOLUS
500.0000 mL | Freq: Once | INTRAVENOUS | Status: DC
Start: 2015-08-24 — End: 2015-08-24

## 2015-08-24 MED ORDER — LACTATED RINGERS IV SOLN
INTRAVENOUS | Status: DC
Start: 2015-08-24 — End: 2015-08-27

## 2015-08-24 MED ORDER — MORPHINE SULFATE (PF) 1 MG/ML IJ SOLN
INTRAMUSCULAR | Status: DC | PRN
Start: 2015-08-24 — End: 2015-08-24
  Administered 2015-08-24: 2 mg via INTRAVENOUS

## 2015-08-24 MED ORDER — OXYTOCIN-LACTATED RINGERS 30 UNIT/500ML IV SOLN
7.5000 [IU]/h | INTRAVENOUS | Status: DC
Start: 2015-08-24 — End: 2015-08-25
  Administered 2015-08-24: 7.5 [IU]/h via INTRAVENOUS

## 2015-08-24 MED ORDER — OXYCODONE-ACETAMINOPHEN 5-325 MG PO TABS
1.0000 | ORAL_TABLET | ORAL | Status: DC | PRN
Start: 2015-08-24 — End: 2015-08-27
  Administered 2015-08-25 – 2015-08-27 (×12): 1 via ORAL
  Filled 2015-08-24 (×12): qty 1

## 2015-08-24 MED ORDER — OXYTOCIN-LACTATED RINGERS 30 UNIT/500ML IV SOLN
7.5000 [IU]/h | INTRAVENOUS | Status: AC
Start: 2015-08-24 — End: 2015-08-25

## 2015-08-24 MED ORDER — IBUPROFEN 600 MG PO TABS
ORAL_TABLET | ORAL | Status: AC
Start: 2015-08-24 — End: ?
  Filled 2015-08-24: qty 1

## 2015-08-24 MED ORDER — MEASLES, MUMPS & RUBELLA VAC SC INJ
0.5000 mL | INJECTION | SUBCUTANEOUS | Status: DC | PRN
Start: 2015-08-24 — End: 2015-08-27

## 2015-08-24 MED ORDER — ACETAMINOPHEN 325 MG PO TABS
650.0000 mg | ORAL_TABLET | Freq: Four times a day (QID) | ORAL | Status: DC | PRN
Start: 2015-08-24 — End: 2015-08-27

## 2015-08-24 SURGICAL SUPPLY — 27 items
CLEANER ELECTROSURGICAL TIP PENCIL (Procedure Accessories) ×1
CLEANER ELECTROSURGICAL TIP PENCIL HANDSWITCH LECTROBRASIVE (Procedure Accessories) ×1 IMPLANT
CLEANER ESURG TIP PNCL LCTRBRS STRL (Procedure Accessories) ×1
DRESSING ISLAND TELFA 4X10 (Dressing) ×2 IMPLANT
GAUZE SPONGE CURITY 12PLY 4X8 (Dressing) ×2 IMPLANT
GLOVE SRG 6.5 BGL SNSR LTX STRL PF TXTR (Glove) ×1
GLOVE SURGICAL 6 1/2 BIOGEL SENSOR (Glove) ×1
GLOVE SURGICAL 6 1/2 BIOGEL SENSOR POWDER FREE TEXTURE BEAD CUFF STRAW (Glove) ×1 IMPLANT
MARKER SKIN (Positioning Supplies) ×2 IMPLANT
PAD ELECTROSRG GRND REM W CRD (Procedure Accessories) ×2 IMPLANT
PENCIL ELECTRO PUSH BUTTON (Cautery) ×2 IMPLANT
SLEEVE CMPR MED KN LGTH KDL SCD 21- IN (Sleeve) ×2
SLEEVE COMPRESSION MEDIUM KNEE LENGTH KENDALL SEQUENTIAL OD21- IN (Sleeve) ×1 IMPLANT
STAPLER SKIN L3.9 MM X W6.9 MM WIDE 35 (Skin Closure) ×1
STAPLER SKIN L3.9 MM X W6.9 MM WIDE 35 COUNT FIX HEAD RATCHET (Skin Closure) ×1 IMPLANT
STAPLER SKN SS W PRX PX .58MM 3.9X6.9MM (Skin Closure) ×1
STRIP SKIN CLOSURE L4 IN X W1/2 IN (Dressing) ×1
STRIP SKIN CLOSURE L4 IN X W1/2 IN REINFORCE STERI-STRIP POLYESTER (Dressing) ×1 IMPLANT
STRIP SKNCLS PLSTR STRSTRP 4X.5IN LF (Dressing) ×1
SUTURE ABS 3-0 SH VCL 27IN BRD COAT VIOL (Suture) ×1
SUTURE ABS 4-0 FS2 VCL 27IN BRD COAT UD (Suture) ×1
SUTURE COATED VICRYL 3-0 SH L27 IN BRAID (Suture) ×1
SUTURE COATED VICRYL 3-0 SH L27 IN BRAID COATED VIOLET ABSORBABLE (Suture) ×1 IMPLANT
SUTURE COATED VICRYL 4-0 FS-2 L27 IN (Suture) ×1 IMPLANT
SUTURE VICRYL 0 CT 36IN (Suture) ×4 IMPLANT
SUTURE VICRYL 0-0 CT1 (Suture) ×4 IMPLANT
UNDRGLOV SZ 7 PI INDICATR BLUE (Glove) ×2 IMPLANT

## 2015-08-24 NOTE — Progress Notes (Addendum)
Per Dr. Lavone Nian, increase Pit to 8. MD at bedside

## 2015-08-24 NOTE — Progress Notes (Signed)
Labor Check     Pt tolerating contractions well.     Filed Vitals:    08/24/15 0330 08/24/15 0400 08/24/15 0430 08/24/15 0500   BP: 121/64 113/62 121/56 124/59   Pulse: 66 87 71 73   Temp:  98.9 F (37.2 C)     TempSrc:  Oral     Resp:  17     Height:       Weight:         EFM: 150, moderate, + accels, no decels  Toco: ctx q2-84min  SVE: 6/100/-1    A/P: 35 y.o. G1P0 @ [redacted]w[redacted]d, in active labor  Rh+/RI/GBS +  EFM category 1  Continue current mgmt  Anticipate NSVD    Prince Solian, PGY2

## 2015-08-24 NOTE — Plan of Care (Addendum)
Initial Lactation Visit:  G1P1001  Delivered: 08/24/2015  6:33 PM   Female  via Cesarean  at [redacted]w[redacted]d    Birth Weight: 8 lb 8.9 oz (3880 g)   Feeding Type:    Breast milk  APGAR: 8   and 9      No Known Allergies  Past Medical History   Diagnosis Date   . 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  Tel # given 02-22-15. dpr   . Group B streptococcal infection in pregnancy      History reviewed. No pertinent past surgical history.    Rounded on Mom to review breastfeeding basics.   Reviewed proper positioning and hand placement for deep latch.  Mom said she just attempted to feed the baby an hour prior to the visit and did not want the baby awaken at that time.  Encouraged to practice skin to skin contact and offer breast with all hunger cues (at least 8-12 times per 24 hours).   Watch baby for signs of effective feedings (adequate number of voids, stools and minimal weight loss).Avoid pacifiers and formula supplements unless it is medically indicated.   Also mentioned normal newborn behaviors, including sleep patterns and cluster feeding.   Reviewed breast feeding section of Mother/Infant booklet.  Contact number given - Patient to call for questions or assistance as needed.   Follow up PRN.    Lactation consult completed.  POC reviewed, mom verbalized understanding.     Patient exclusively breastfeeding infant: yes  Patient feeding formula: no  Patient pumping: no    2345  Mom was assisted in letting the baby latched on the left breast with mom holding the baby in cross cradle hold;used the manual pump on mom's short nipple and was able to express colostrum and given to the baby thru a syringe;the baby did not latch and was crying when put at the breast;will follow PRN.

## 2015-08-24 NOTE — Progress Notes (Addendum)
Labor Check Assuming care of Ms. Daliah A Mccubbin 35 y.o. G1P0 at [redacted]w[redacted]d admitted for IOL at late term. Rounded at 730.     Patient resting comfortably in bed with CEI in place.    BP 135/75 mmHg  Pulse 73  Temp(Src) 98.6 F (37 C) (Oral)  Resp 18  Ht 1.6 m (5\' 3" )  Wt 88.905 kg (196 lb)  BMI 34.73 kg/m2  LMP 11/08/2014 (Exact Date)  Breastfeeding? Yes  EFM: 135/min-mod/+acc/no dec, tracing noted to be fairly flat, no accels or decels, mod variability by night team. D5 ordered on AM rounds, now with accels.  Toco: ctx q74min  SVE:  No new exam    A/P: 35 y.o. G1P0 admitted for IOL at [redacted]w[redacted]d. Pregnancy c/b PIH.  Rh+/GBS+  - EFM category 2  - CEI in place  - AROM mec at 2300  - Pitocin 8  - s/p PCN x2  - Anticipate NSVD     Burnard Leigh MD PGY1      Chart reviewed  FHR cat 2 due to periods of min variability, but otherwise reassuring with accels  Making cervical change  Last exam 6 cm/ 100/-1 by Dr Lavone Nian**  Expectant management    Linna Hoff, MD PGY4

## 2015-08-24 NOTE — Progress Notes (Signed)
Labor Check     Patient resting comfortably in bed with CEI in place.    BP 122/58 mmHg  Pulse 76  Temp(Src) 98.6 F (37 C) (Oral)  Resp 20  Ht 1.6 m (5\' 3" )  Wt 88.905 kg (196 lb)  BMI 34.73 kg/m2  LMP 11/08/2014 (Exact Date)  Breastfeeding? Yes  EFM: 145/mod/ no acc/no dec/ +scalp stim  Toco: ctx q88min  SVE: 9/100/0    A/P: 35 y.o. G1P0 admitted for IOL at [redacted]w[redacted]d. Pregnancy c/b PIH.  Rh+/GBS+  - EFM category 1  - CEI in place  - AROM mec at 2300  - Pitocin 8  - s/p PCN x2  - Anticipate NSVD     Burnard Leigh MD PGY1

## 2015-08-24 NOTE — Plan of Care (Signed)
Problem: Labor & Delivery  Goal: Free from Maternal/Fetal Infection  Patient is GBS positive.  Managing infection with IV pen.  Pt is afebrile.  Will continue to treat until delivery.

## 2015-08-24 NOTE — Progress Notes (Signed)
Late entry for 12:17    Pt comfortably with an epidural    BP 120/59 mmHg  Pulse 65  Temp(Src) 99.1 F (37.3 C) (Oral)  Resp 20  Ht 1.6 m (5\' 3" )  Wt 88.905 kg (196 lb)  BMI 34.73 kg/m2  LMP 11/08/2014 (Exact Date)  Breastfeeding? Yes  EFM: 150/mod/ no acc/no dec  Toco: ctx q1-86min  VE: 10/0 by Dr Jean Rosenthal      A/P: 35 y.o. G1P0 admitted for IOL at [redacted]w[redacted]d. Pregnancy c/b PIH.  Rh+/GBS+  - EFM category 1  - Will labor down  - s/p PCN x2  - Anticipate NSVD     Linna Hoff, MD PGY4

## 2015-08-24 NOTE — Progress Notes (Signed)
Rounded on patient who has agreed to C/S for failure to descend after pushing for a total of 3hrs. Fetus also remaining tachy to 170s-180s. Patient to receive 300cc bolus of D5NS.     Pt was consented for cesarean section. Counseled re: risks/benefits/alternatives of the procedure. Risks include infection, bleeding possibly requiring transfusion or hysterectomy, inadvertent injury to surrounding organs, possibility of repeat c-section, and increased risk of complications in future pregnancies. All questions answered, informed consent signed and in chart.    Burnard Leigh MD PGY1

## 2015-08-24 NOTE — Transfer of Care (Signed)
Anesthesia Transfer of Care Note    Patient: April [name]    Procedures performed: Procedure(s):  CESAREAN SECTION    Anesthesia type: Epidural    Patient location:Phase I PACU    Last vitals:   Filed Vitals:    08/24/15 1730   BP: 114/58   Pulse: 63   Temp:    Resp:        Post pain: Patient not complaining of pain, continue current therapy      Mental Status:awake    Respiratory Function: tolerating face mask    Cardiovascular: stable    Nausea/Vomiting: patient not complaining of nausea or vomiting    Hydration Status: adequate    Post assessment: no apparent anesthetic complications, no reportable events and no evidence of recall    Signed by: Woodroe Mode Jaquanda Wickersham  08/24/2015 7:35 PM

## 2015-08-24 NOTE — Plan of Care (Signed)
Pt a/o x4, VSS, fundus firm @ U/U, lochia WNL. Pt oriented to unit and room, white board updated. Plan of care and safety instructions discussed, contract signed. All questions answered, pt verbalized understanding of care. Will continue hourly rounding.

## 2015-08-24 NOTE — Progress Notes (Signed)
Dr. Jean Rosenthal at bedside.  C-section discussed.  Pt is in pain and tearful.  Pt consents to C-section.  MD discusses risks and benefits.  Pt signed consent form.

## 2015-08-24 NOTE — Op Note (Signed)
FULL OPERATIVE NOTE    Date Time: 08/24/2015 7:28 PM  Patient Name: April [name]  Attending Physician: [facility], Physician*      Date of Operation:   08/24/2015    Providers Performing:   Surgeon(s):  Burnard Leigh, MD  Linna Hoff, MD  Knute Neu, MD  Doug Sou, MD    Circulator: Zeb Comfort, RN  Relief Circulator: Domingo Cocking, RN  Relief Scrub: Hart Rochester I  Scrub Person: Augustine Radar    Operative Procedure:   Procedure(s):  CESAREAN SECTION    Preoperative Diagnosis:   Pre-Op Diagnosis Codes:     * Arrest of descent, delivered, current hospitalization [O62.1]     * [redacted] weeks gestation of pregnancy [O48.0, Z3A.41]    Postoperative Diagnosis:   Post-Op Diagnosis Codes:     * Arrest of descent, delivered, current hospitalization [O62.1]     * [redacted] weeks gestation of pregnancy [O48.0, Z3A.41]    Indications:   Failure of descent at complete at 0 with pushing for 2.5-3 hours.    Operative Notes:    After obtaining informed consent, the patient was taken to the OR where epidural/spinal anesthesia was noted to be adequate. Patient was placed in the dorsal supine position with [name] leftward tilt. SCDs were placed on bilateral lower extremities. The patient was prepped and draped in the normal sterile fashion. [name] Foley catheter was placed to allow bladder drainage. The patient received Ancef 2g prior to incision.   [name] Pfannenstiel skin incision was made with [name] scalpel and carried down to the underlying layer of fascia with the scalpel. Several bleeders were noted and cauterized with the bovie. The fascia was incised with the scalpel and extended bilaterally with Mayo scissors. Attention was turned to the inferior aspect of the fascial incision, which was elevated with Kocher clamps and the underlying rectus muscle dissected off with blunt dissection. Attention was then turned to the superior aspect of the fascial incision, which in [name] similar fashion, was elevated with Kocher  clamps and the underlying rectus muscle dissected off with Mayo scissors and blunt dissection. The rectus muscles were separated in the midline. The peritoneum was identified, entered bluntly, and extended superiorly and inferiorly with good visualization of the bladder. The bladder blade was inserted and the vesicouterine peritoneum identified.    The uterus was incised with [name] scalpel in [name] low transverse incision and extended bilaterally with blunt technique. The infant's head was elevated to the level of the incision and delivered atraumatically with the body following. The oronasopharynx was bulb-suctioned and the cord was clamped and cut. The neonate was subsequently handed off to neonatology. The placenta was delivered spontaneously and intact, with [name] 3-vessel cord noted.   The uterus was exteriorized and cleared of all clots and debris. The edges of the uterus were grasped with ring forceps and Allis clamps. [name] right lateral extension of the incision was noted. The hysterotomy site was repaired with [name] running, locked suture of 0-Monocryl. 0-vicryl was used in figure of 8 fashion for hemostasis and reinforcement the lower uterine segment. The uterus was returned to the abdominal cavity and the gutters were cleared of all clot and debris. Attention was returned to the uterine incision, which was found to be hemostatic.    The fascia was re-approximated with 0-Vicryl in [name] running fashion. The subcutaneous layer was irrigated. Any bleeding was made hemostatic with the Bovie. The subcutaneous tissue was re-approximated with 2-0 Plain gut running  suture. The skin was re-approximated with 4-0 Monocryl in [name] subcuticular fashion. Steri-strips and abdominal dressing were placed.   The patient tolerated the procedure well. All sponge, lap, and needle counts were correct times two. The patient was then transported to the PACU in stable condition.     FINDINGS:  Delivery of [name] viable female infant, VTX presentation, OA  position, 8 lb 8.9 oz (3880 g) , Apgars 8 /9 , no nuchal cord, thick meconium noted at ROM on L+D. Placenta delivered spontaneously and intact with 3-vessel cord. Tubes and ovaries normal bilaterally. Fundus was enlarge with fibroids.    Estimated Blood Loss:   600 mL, 2700cc IVF, 200cc    Implants:   None     Drains:   Drains: foley, hematuria noted from L+D room was much improved upon leaving the OR.    Specimens:   None    Complications:   Right lateral extension of uterine incision    Signed by: Burnard Leigh, MD      [place] [id number]

## 2015-08-24 NOTE — Progress Notes (Addendum)
Labor Check     Pt tolerating contractions well.     Filed Vitals:    08/24/15 0030 08/24/15 0100 08/24/15 0130 08/24/15 0158   BP: 129/75 130/71 128/71    Pulse: 60 61 63    Temp:    99.1 F (37.3 C)   TempSrc:    Axillary   Resp:       Height:       Weight:         EFM: 145, moderate, + accels, no decels  Toco: ctx q59min  SVE: 5/100/-2    A/P: 35 y.o. G1P0 @ [redacted]w[redacted]d, in labor  Rh+/RI/GBS+  EFM category 1  Continue current mgmt  Anticipate NSVD    Prince Solian, PGY2

## 2015-08-24 NOTE — Progress Notes (Signed)
Labor Check     Pushing    BP 120/59 mmHg  Pulse 65  Temp(Src) 99.1 F (37.3 C) (Oral)  Resp 20  Ht 1.6 m (5\' 3" )  Wt 88.905 kg (196 lb)  BMI 34.73 kg/m2  LMP 11/08/2014 (Exact Date)  Breastfeeding? Yes  EFM: 170s/mod/ no acc/early and var dec/       A/P: 35 y.o. G1P0 admitted for IOL at [redacted]w[redacted]d. Pregnancy c/b PIH.  Rh+/GBS+  - EFM category 2, due to decels and tachy. IVF, O2. Has good viability. Will monitor closely.  - CEI in place  - Pt has been pushing for 2 hours with minimal progress. Pt counseled for a c/s due to poor progress, narrow pelvic arch, OP, cat 2 tracing as vaginal delivery is unlikely.  - At this time, pt not ready for a c/s and would like to keep pushing.  - Will try pushing on her side  - Pt seen and examined with Dr Stoney Bang    Linna Hoff, MD PGY4

## 2015-08-24 NOTE — Progress Notes (Signed)
Drs. Lin Givens and Louanne Skye checked pt.  They determined that pt was not pushing effectively and believe that baby is OP and that pt's pelvis is narrow.  They suggested a c-section.  Pt requested more time to push.  Doctors agreed to give patient another hour to push.

## 2015-08-25 ENCOUNTER — Encounter (HOSPITAL_BASED_OUTPATIENT_CLINIC_OR_DEPARTMENT_OTHER): Payer: Self-pay | Admitting: Obstetrics & Gynecology

## 2015-08-25 LAB — CBC AND DIFFERENTIAL
Basophils Absolute Automated: 0.02 10*3/uL (ref 0.00–0.20)
Basophils Automated: 0 %
Eosinophils Absolute Automated: 0.09 10*3/uL (ref 0.00–0.70)
Eosinophils Automated: 1 %
Hematocrit: 27.3 % — ABNORMAL LOW (ref 37.0–47.0)
Hgb: 9 g/dL — ABNORMAL LOW (ref 12.0–16.0)
Immature Granulocytes Absolute: 0.05 10*3/uL
Immature Granulocytes: 0 %
Lymphocytes Absolute Automated: 1.66 10*3/uL (ref 0.50–4.40)
Lymphocytes Automated: 15 %
MCH: 27.5 pg — ABNORMAL LOW (ref 28.0–32.0)
MCHC: 33 g/dL (ref 32.0–36.0)
MCV: 83.5 fL (ref 80.0–100.0)
MPV: 11.2 fL (ref 9.4–12.3)
Monocytes Absolute Automated: 0.58 10*3/uL (ref 0.00–1.20)
Monocytes: 5 %
Neutrophils Absolute: 8.93 10*3/uL — ABNORMAL HIGH (ref 1.80–8.10)
Neutrophils: 79 %
Nucleated RBC: 0 /100 WBC (ref 0–1)
Platelets: 170 10*3/uL (ref 140–400)
RBC: 3.27 10*6/uL — ABNORMAL LOW (ref 4.20–5.40)
RDW: 17 % — ABNORMAL HIGH (ref 12–15)
WBC: 11.33 10*3/uL — ABNORMAL HIGH (ref 3.50–10.80)

## 2015-08-25 NOTE — Plan of Care (Signed)
Patient is stable and without questions or concerns at this time. Safety protocol reviewed this shift. Patient verbalizes understanding of safety.  POC reviewed this shift and patient verbalizes understanding and continues to progress.    Patient pain level within patient stated goal: Yes  Patient breastfeeding infant: Yes  Patient pumping: Yes, hand pump   Patient lochia appropriate: Yes, rubra, small amount, no clots noted   Foley out and voiding: No, foley in place draining clear amber urine to gravity. Fluids encouraged   fAbdominal incision clean, dry and well approximated: ABD in place   Nutrition adequate for discharge: no, clear liquid diet at this time   Patient reports constipation: Yes, stool softener given   Patient agrees to notify RN of any changes in her condition/status: Yes

## 2015-08-25 NOTE — Progress Notes (Signed)
Anesthesia Department Obstetric Follow-up    Patient: April [name]    Anesthesia type: Epidural    Post-operative pain managment: Epidural Morphine    Complaints/Side Effects:  None    Last vitals:   Filed Vitals:    08/25/15 1332   BP: 112/73   Pulse: 94   Temp: 36.5 C (97.7 F)   Resp: 18   SpO2: 95%        Mental Status:awake and alert     Pain (assessment and plan):  Patient not complaining of pain, continue current therapy     Respiratory Function: tolerating room air    Nausea/Vomiting: patient not complaining of nausea or vomiting    Post assessment: no apparent anesthetic complications

## 2015-08-25 NOTE — Lactation Note (Signed)
Follow up:  Mother trying to rest, baby transferred to NICU. I brought NICU packet and pumping supplies with syringes/medicine cups.   Will have night PM LC follow up.

## 2015-08-25 NOTE — Anesthesia Postprocedure Evaluation (Signed)
Anesthesia Post Evaluation    Patient: April [name]    Procedure(s):  CESAREAN SECTION    Anesthesia type: epidural    Last Vitals:   Filed Vitals:    08/25/15 1332   BP: 112/73   Pulse: 94   Temp: 36.5 C (97.7 F)   Resp: 18   SpO2: 95%       Patient Location: Med Surgical Floor      Post Pain: Patient not complaining of pain, continue current therapy    Mental Status: awake    Respiratory Function: tolerating room air    Cardiovascular: stable    Nausea/Vomiting: patient not complaining of nausea or vomiting    Hydration Status: adequate    Post Assessment: no apparent anesthetic complications and no reportable events          Anesthesia Qualified Clinical Data Registry    Central Line      CVC insertion : NO                                               Perioperative temperature management      General/neuraxial anesthesia > or = 60 minutes (excluding CABG) : YES              > Use of intraoperative active warming : YES              > Temperature > or = 36 degrees Centigrade (96.8 degrees Farenheit) during time span from 30 minutes before up to 15 minutes after anesthesia end time : YES      Administration of antibiotic prophylaxis      Age > or = 18, with IV access, with surgical procedure for which antibiotic prophylaxis indicated, and not on chronic antibiotics : YES              > Prophylactic antibiotics within 1 hour of incision (or fluroroquinolone/vancomycin within 2 hours of incision) : YES    Medication Administration      Ordering or administration of drug inconsistent with intended drug, dose, delivery or timing : NO      Dental loss/damage      Dental injury with administration of anesthesia : NO      Difficult intubation due to unrecognized difficult airway        Elective airway procedure including but not limited to: tracheostomy, fiberoptic bronchoscopy, rigid bronchoscopy; jet ventilation; or elective use of a device to facilitate airway management such as a Glidescope : NO                >  Unanticipated difficult intubation post pre-evaluation : NO      Aspiration of gastric contents        Aspiration of gastric contents : NO                    Surgical fire        Procedure requiring electrocautery/laser : YES                > Ignition/burning in invasive procedure location : NO      Immediate perioperative cardiac arrest        Cardiac arrest in OR or PACU : NO                    Unplanned hospital admission  Unplanned hospital admission for initially intended outpatient anesthesia service : NO      Unplanned ICU admission        Unplanned ICU admission related to anesthesia occurring within 24 hours of induction or start of MAC : NO      Surgical case cancellation        Cancellation of procedure after care already started by anesthesia care team : NO      Post-anesthesia transfer of care checklist/protocol to PACU        Transfer from OR to PACU upon case conclusion : YES              > Use of PACU transfer checklist/protocol : YES     (Includes the key elements of: patient identification, responsible practitioner identification (PACU nurse or advanced practitioner), discussion of pertinent history and procedure course, intraoperative anesthetic management, post-procedure plans, acknowledgement/questions)    Post-anesthesia transfer of care checklist/protocol to ICU        Transfer from OR to ICU upon case conclusion : NO                    Post-operative nausea/vomiting risk protocol        Post-operative nausea/vomiting risk protocol : YES  Patient > or = 18 with care initiated by anesthesia team that has a risk factor screen for post-op nausea/vomiting (Includes female, hx PONV, or motion sickness, non-smoker, intended opioid administration for post-op analgesia.)    Anaphylaxis        Anaphylaxis during anesthesia services : NO    (Inclusive of any suspected transfusion reaction in association with blood-bank confirmed blood product incompatibility)              Fermin Schwab, 08/25/2015  3:06 PM

## 2015-08-25 NOTE — Lactation Note (Signed)
Double electric pumping initiated by  LC                     Suction set to  25%.  NICU packet given and NICU protocols reviewed.  Encouraged to pump 15-20 minutes 8-10 times per 24 hours.    Instructions included the following information:   Use a breast pump or hand expression to start your milk supply and keep it going.  Guidelines for Pumping:  Begin as soon as possible after birth (within 6 hours).   Pump 8-12 times per each 24 -hour period.  Try not to go more than 4 hours without pumping (including overnight).   Wash hands thoroughly before handling the breast pump or collection kit.   Unless told otherwise, use a hospital-grade electric pump. It is recommended that you rent a hospital grade pump from the hospital until baby is discharged from NICU and/or breastfeeding well.   Pump from both breasts at the same time (double pump) Double pumping increases prolactin, the hormone that stimulates the breasts to produce milk.   Massage the breasts before pumping. This helps stimulate "let-down" of the milk.   Start with a low pump setting and increase it as much as possible without causing any pain or damage to the breast.   It is common to only see a few drops of colostrum in the first few days after birth. Keep pumping regularly, there will be an increase in quantity in 3-5 days.  Storing Breast Milk:  Use sterile containers called Snappies or oral syringes. The Lactation Consultant or RN in [facility] or the secretary in the NICU will provide them.   Label each bottle by obtaining an infant label and write the date and time the milk was pumped on the label. List any medications that are being taken.  Expressed breast milk should be taken to the NICU right away or put into the Breast milk refrigerator until it can be taken to the baby.   Cleaning pump parts:   Do not wash white cap or tubing. Remove flange, white valve and rubber diaphragm and wash with hot, soapy water in pink basin. Dry with paper towels as well as  possible. Place on dry paper towels in bottom of clean basin until ready to pump next time.     Follow up in AM.    Lactation consult completed.  POC reviewed, patient verbalizes understanding and continues to progress.    Mother is pumping for NICU infant.

## 2015-08-25 NOTE — Plan of Care (Signed)
Infant transferred to NICU at 1500. Tried to set up pumping supplies for patient at 1600 but patient wanted to rest for a few hours. Notified lactation that mom needs to start pumping for infant.     Patient is stable and without questions or concerns at this time. Hourly rounded to ensure patient safety and address any needs.POC reviewed this shift and patient verbalizes understanding and continues to progress.      Patient pain level within patient stated goal yes  Patient breastfeeding infant Earlier this morning, now needs to start pumping for infant in NICU  Patient pumping hand pump  Patient lochia scant yes  Foley out and voiding yes  Abdominal incision clean, dry and well approximated yes  Nutrition adequate for discharge yes  Patient reports passing gas yes  Patient agrees to notify RN of any changes in her condition/status yes

## 2015-08-25 NOTE — Progress Notes (Signed)
POD#1    S: Feeling well, no complaints. Pain well-controlled w/ PO meds. Foley in place draining clear urine, OOB to bathroom x2 overnight. Tolerating PO clears. Breast/bottle feeding. Lochia < menses.     O:   Filed Vitals:    08/25/15 0657   BP:    Pulse:    Temp:    Resp: 17   SpO2:      Gen: NAD, aox3  Abd: soft, appropriately TTP, incision c/d/i, well-approximated with steri strips   Ext: no c/c/e    Results     Procedure Component Value Units Date/Time    CBC with differential in AM POD #1 [[id number]]  (Abnormal) Collected:  08/25/15 0618    Specimen Information:  Blood from Blood Updated:  08/25/15 0711     WBC 11.33 (H) x10 3/uL      Hgb 9.0 (L) g/dL      Hematocrit 40.1 (L) %      Platelets 170 x10 3/uL      RBC 3.27 (L) x10 6/uL      MCV 83.5 fL      MCH 27.5 (L) pg      MCHC 33.0 g/dL      RDW 17 (H) %      MPV 11.2 fL      Neutrophils 79 %      Lymphocytes Automated 15 %      Monocytes 5 %      Eosinophils Automated 1 %      Basophils Automated 0 %      Immature Granulocyte 0 %      Nucleated RBC 0 /100 WBC      Neutrophils Absolute 8.93 (H) x10 3/uL      Abs Lymph Automated 1.66 x10 3/uL      Abs Mono Automated 0.58 x10 3/uL      Abs Eos Automated 0.09 x10 3/uL      Absolute Baso Automated 0.02 x10 3/uL      Absolute Immature Granulocyte 0.05 x10 3/uL     Type and screen [[id number]] Collected:  08/23/15 1837    Specimen Information:  Blood Updated:  08/23/15 2002     ABO Rh A POS      AB Screen Gel NEG     GFR [[id number]] Collected:  08/23/15 1836     EGFR >60.0 Updated:  08/23/15 1919    Lactate dehydrogenase [[id number]] Collected:  08/23/15 1836    Specimen Information:  Blood Updated:  08/23/15 1919     LDH 170 U/L     Creatinine, serum [[id number]] Collected:  08/23/15 1836    Specimen Information:  Blood Updated:  08/23/15 1919     Creatinine 0.6 mg/dL     AST [[id number]] Collected:  08/23/15 1836    Specimen Information:  Blood Updated:  08/23/15 1919     AST (SGOT) 11 U/L     ALT [[id number]]  Collected:  08/23/15 1836    Specimen Information:  Blood Updated:  08/23/15 1919     ALT 13 U/L     Uric acid [[id number]] Collected:  08/23/15 1836    Specimen Information:  Blood Updated:  08/23/15 1919     Uric acid 2.9 mg/dL     CBC without differential [[id number]]  (Abnormal) Collected:  08/23/15 1836    Specimen Information:  Blood from Blood Updated:  08/23/15 1906     WBC 7.69 x10 3/uL      Hgb 12.5 g/dL  Hematocrit 37.6 %      Platelets 261 x10 3/uL      RBC 4.53 x10 6/uL      MCV 83.0 fL      MCH 27.6 (L) pg      MCHC 33.2 g/dL      RDW 16 (H) %      MPV 11.4 fL      Nucleated RBC 0 /100 WBC     RPR [[id number]] Resulted:  03/04/15     Specimen Information:  Blood Updated:  08/23/15 1811     RPR Nonreactive     Gonococcus culture [[id number]] Resulted:  03/04/15     Specimen Information:  Other Updated:  08/23/15 1810     Chlamydia trachomatis Culture Negative      Culture Gonorrhoeae Negative     GBS Transcribed [[id number]] Resulted:  07/11/15      GBS Transcribed Positive Updated:  08/23/15 1809          A/P: 35 y.o. G1P1001 s/p pLTCS for arrest of descent, POD#1  Rh+/RI/VFI  [place] foley and liberalize activity  Advance diet   Dressing removed   Anticipate [place] home tomorrow, POD#2    Remo Lipps, DO (PGY-1)       I have reviewed the notes, assessments, and/or procedures performed by Dr. Sherlon Handing, I concur with her documentation of April [name].    Georgetta Haber, MD  [facility] Attending Ob/Gyn

## 2015-08-26 NOTE — Plan of Care (Signed)
VSS. Pt strongly encouraged to pump q2-3 hours as baby is in NICU. Pt verbalizes understanding and was visualized using the electronic pump correctly, no colostrum collected due to low output. Pt's pain well-managed with PRN Percocet and scheduled Ibuprofen. Pt medicated with Simethicone due to distended abdomen and c/o gas pains. Lochia rubra and scant. Pt and spouse updated on POC.

## 2015-08-26 NOTE — Lactation Note (Signed)
Patient is pumping per NICU protocol.  Flanges fit well.   Will go to bf baby in NICU. Follow up PRN

## 2015-08-26 NOTE — Progress Notes (Signed)
POD#2    S: Patient feeling well this AM, no acute events overnight. Pain well-controlled w/ PO meds. Lochia decreasing, less than menses. Ambulating w/out dizziness, voiding freely. Tolerating PO diet without N/V, passing flatus. Mom pumping    O:   Temp:  [97.7 F (36.5 C)-98.1 F (36.7 C)] 97.9 F (36.6 C)  Heart Rate:  [78-94] 78  Resp Rate:  [16-20] 17  BP: (103-125)/(64-81) 114/73 mmHg  Gen: NAD, AOx3  Abd: Soft, fundus firm, appropriately TTP, incision C/D/I  Ext: No LE edema, negative Homans, no calf tenderness    Results     Procedure Component Value Units Date/Time    CBC with differential in AM POD #1 [[id number]]  (Abnormal) Collected:  08/25/15 0618    Specimen Information:  Blood from Blood Updated:  08/25/15 0711     WBC 11.33 (H) x10 3/uL      Hgb 9.0 (L) g/dL      Hematocrit 82.9 (L) %      Platelets 170 x10 3/uL      RBC 3.27 (L) x10 6/uL      MCV 83.5 fL      MCH 27.5 (L) pg      MCHC 33.0 g/dL      RDW 17 (H) %      MPV 11.2 fL      Neutrophils 79 %      Lymphocytes Automated 15 %      Monocytes 5 %      Eosinophils Automated 1 %      Basophils Automated 0 %      Immature Granulocyte 0 %      Nucleated RBC 0 /100 WBC      Neutrophils Absolute 8.93 (H) x10 3/uL      Abs Lymph Automated 1.66 x10 3/uL      Abs Mono Automated 0.58 x10 3/uL      Abs Eos Automated 0.09 x10 3/uL      Absolute Baso Automated 0.02 x10 3/uL      Absolute Immature Granulocyte 0.05 x10 3/uL     Type and screen [[id number]] Collected:  08/23/15 1837    Specimen Information:  Blood Updated:  08/23/15 2002     ABO Rh A POS      AB Screen Gel NEG     GFR [[id number]] Collected:  08/23/15 1836     EGFR >60.0 Updated:  08/23/15 1919    Lactate dehydrogenase [[id number]] Collected:  08/23/15 1836    Specimen Information:  Blood Updated:  08/23/15 1919     LDH 170 U/L     Creatinine, serum [[id number]] Collected:  08/23/15 1836    Specimen Information:  Blood Updated:  08/23/15 1919     Creatinine 0.6 mg/dL     AST [[id number]]  Collected:  08/23/15 1836    Specimen Information:  Blood Updated:  08/23/15 1919     AST (SGOT) 11 U/L     ALT [[id number]] Collected:  08/23/15 1836    Specimen Information:  Blood Updated:  08/23/15 1919     ALT 13 U/L     Uric acid [[id number]] Collected:  08/23/15 1836    Specimen Information:  Blood Updated:  08/23/15 1919     Uric acid 2.9 mg/dL     CBC without differential [[id number]]  (Abnormal) Collected:  08/23/15 1836    Specimen Information:  Blood from Blood Updated:  08/23/15 1906     WBC 7.69 x10 3/uL  Hgb 12.5 g/dL      Hematocrit 44.0 %      Platelets 261 x10 3/uL      RBC 4.53 x10 6/uL      MCV 83.0 fL      MCH 27.6 (L) pg      MCHC 33.2 g/dL      RDW 16 (H) %      MPV 11.4 fL      Nucleated RBC 0 /100 WBC     RPR [[id number]] Resulted:  03/04/15     Specimen Information:  Blood Updated:  08/23/15 1811     RPR Nonreactive     Gonococcus culture [[id number]] Resulted:  03/04/15     Specimen Information:  Other Updated:  08/23/15 1810     Chlamydia trachomatis Culture Negative      Culture Gonorrhoeae Negative     GBS Transcribed [[id number]] Resulted:  07/11/15      GBS Transcribed Positive Updated:  08/23/15 1809          A/P: 35 y.o. G1P1001 s/p pLTCS for arrest of descent 0 station, pushing for 2.5-3 hours, POD#1  Rh+/RI/VFI  - Encourage ambulation and breastfeeding   - Contraception: undecided  - Discharge home tomorrow as baby is in the NICU w/ follow-up clinic appointment in 6 weeks    Burnard Leigh MD PGY1

## 2015-08-26 NOTE — Plan of Care (Signed)
Pt assessment and VSS. POC reviewed this shift and pt verbalizes understanding. Incision site intact with old dried drainage. Pt managing pain well with meds and ambulating with no problems. Lochia intact with scant bleeding noted. Encouraged pt to pump every 2-3hours. Pt continues to progress this shift. I will monitor and report any changes to MD.

## 2015-08-27 ENCOUNTER — Other Ambulatory Visit: Payer: Self-pay

## 2015-08-27 MED ORDER — DSS 100 MG PO CAPS
200.0000 mg | ORAL_CAPSULE | Freq: Two times a day (BID) | ORAL | 0 refills | Status: AC | PRN
Start: 2015-08-27 — End: ?
  Filled 2015-08-27: qty 30, 10d supply, fill #0

## 2015-08-27 MED ORDER — OXYCODONE-ACETAMINOPHEN 5-325 MG PO TABS
1.0000 | ORAL_TABLET | ORAL | 0 refills | Status: DC | PRN
Start: 2015-08-27 — End: 2017-08-08
  Filled 2015-08-27: qty 15, 3d supply, fill #0

## 2015-08-27 NOTE — Progress Notes (Signed)
POD#3    S: Feeling well, no complaints. Pain well-controlled w/ PO meds. Voiding, ambulating, tolerating regular diet. Breast/bottle feeding. Lochia < menses.     O:   Filed Vitals:    08/27/15 0524   BP: 116/74   Pulse: 71   Temp: 97.7 F (36.5 C)   Resp: 18   SpO2: 96%     Gen: NAD, aox3  Abd: soft, appropriately TTP, incision c/d/i, well-approximated with steri strips   Ext: no c/c/e    Results     Procedure Component Value Units Date/Time    CBC with differential in AM POD #1 [[id number]]  (Abnormal) Collected:  08/25/15 0618    Specimen Information:  Blood from Blood Updated:  08/25/15 0711     WBC 11.33 (H) x10 3/uL      Hgb 9.0 (L) g/dL      Hematocrit 40.9 (L) %      Platelets 170 x10 3/uL      RBC 3.27 (L) x10 6/uL      MCV 83.5 fL      MCH 27.5 (L) pg      MCHC 33.0 g/dL      RDW 17 (H) %      MPV 11.2 fL      Neutrophils 79 %      Lymphocytes Automated 15 %      Monocytes 5 %      Eosinophils Automated 1 %      Basophils Automated 0 %      Immature Granulocyte 0 %      Nucleated RBC 0 /100 WBC      Neutrophils Absolute 8.93 (H) x10 3/uL      Abs Lymph Automated 1.66 x10 3/uL      Abs Mono Automated 0.58 x10 3/uL      Abs Eos Automated 0.09 x10 3/uL      Absolute Baso Automated 0.02 x10 3/uL      Absolute Immature Granulocyte 0.05 x10 3/uL     Type and screen [[id number]] Collected:  08/23/15 1837    Specimen Information:  Blood Updated:  08/23/15 2002     ABO Rh A POS      AB Screen Gel NEG     GFR [[id number]] Collected:  08/23/15 1836     EGFR >60.0 Updated:  08/23/15 1919    Lactate dehydrogenase [[id number]] Collected:  08/23/15 1836    Specimen Information:  Blood Updated:  08/23/15 1919     LDH 170 U/L     Creatinine, serum [[id number]] Collected:  08/23/15 1836    Specimen Information:  Blood Updated:  08/23/15 1919     Creatinine 0.6 mg/dL     AST [[id number]] Collected:  08/23/15 1836    Specimen Information:  Blood Updated:  08/23/15 1919     AST (SGOT) 11 U/L     ALT [[id number]] Collected:   08/23/15 1836    Specimen Information:  Blood Updated:  08/23/15 1919     ALT 13 U/L     Uric acid [[id number]] Collected:  08/23/15 1836    Specimen Information:  Blood Updated:  08/23/15 1919     Uric acid 2.9 mg/dL     CBC without differential [[id number]]  (Abnormal) Collected:  08/23/15 1836    Specimen Information:  Blood from Blood Updated:  08/23/15 1906     WBC 7.69 x10 3/uL      Hgb 12.5 g/dL      Hematocrit 41.6 %  Platelets 261 x10 3/uL      RBC 4.53 x10 6/uL      MCV 83.0 fL      MCH 27.6 (L) pg      MCHC 33.2 g/dL      RDW 16 (H) %      MPV 11.4 fL      Nucleated RBC 0 /100 WBC     RPR [[id number]] Resulted:  03/04/15     Specimen Information:  Blood Updated:  08/23/15 1811     RPR Nonreactive     Gonococcus culture [[id number]] Resulted:  03/04/15     Specimen Information:  Other Updated:  08/23/15 1810     Chlamydia trachomatis Culture Negative      Culture Gonorrhoeae Negative     GBS Transcribed [[id number]] Resulted:  07/11/15      GBS Transcribed Positive Updated:  08/23/15 1809          A/P: 35 y.o. G1P1001 s/p pLTCS for arrest of descent, POD#3  Rh+/RI/VFI  Meeting all milestones  Contraception: condoms  [place] home today w/ follow up in 6 weeks for postpartum visit.     Remo Lipps, DO (PGY-1)

## 2015-08-27 NOTE — Lactation Note (Signed)
Attempted to visit patient but mother asleep at time of visit.  Plan to follow up later today.

## 2015-08-27 NOTE — Plan of Care (Signed)
Pt vitals stable, incision site intact; no evidence of bleeding noted. Discharge instructions completed and all questions and concerns addressed. Pt continues to progress and discharge home.

## 2015-08-27 NOTE — Discharge Summary (Signed)
Obstetrical Discharge Form    Primary OB Clinician: [facility])    EDC: Estimated Date of Delivery: 08/15/15    Gestational Age: [redacted]w[redacted]d    Antepartum Complications:   GBS pos  Elevated 1 hr, normal 3 hr gtt    Date of Delivery: 08/24/2015  Time of Delivery:  6:33 PM      Delivered By: Knute Neu     Delivery Type: Cesarean     Tubal Ligation: no    Baby: Liveborn Female    Apgar 1 minute: 8    Apgar 5 minute: 9    Birth weight: 8 lb 8.9 oz (3880 g)     Anesthesia: Epidural     Delivery Complications:                            Prolonged Second Stage                 Laceration: None  Laceration Type:        Episiotomy: None     Episiotomy/Laceration Repair:        Placenta:  08/24/2015   6:34 PM   Manual removal   Intact     Feeding Method: both breast and bottle    Rh Immune Globulin given: no    Rubella Vaccine given: no    Tdap Vaccine given: no  Immunization History   Administered Date(s) Administered   . Tdap 07/11/2015       Discharge Date/Time:  08/27/2015    Plan:     - Continue with routine postpartum care and breastfeeding support.  - Contraceptive options discussed with patient. Pt opted for condoms.  - Postpartum instructions given to patient.  - Follow-up clinic appointment in 6 weeks or sooner if needed.      Remo Lipps, DO, PGY-1

## 2015-08-27 NOTE — Plan of Care (Signed)
Assessment WNL, Vitals stable.  Patient pain is managed with Ibuprofen and Percocet.  Continue with current plan of care.

## 2015-10-12 ENCOUNTER — Ambulatory Visit (INDEPENDENT_AMBULATORY_CARE_PROVIDER_SITE_OTHER): Payer: Charity | Admitting: Advanced Practice Midwife

## 2015-10-19 ENCOUNTER — Ambulatory Visit: Payer: Self-pay | Attending: Obstetrics & Gynecology | Admitting: Obstetrics & Gynecology

## 2015-10-19 NOTE — Progress Notes (Signed)
Post Partum packet (including Kegel exercises, self breast exam, postpartum depression/available resources) given and discussed with patient. Birth control plan verified with patient. Instruction given to follow up with [facility] women's clinic for pap smear and well woman exam. Patient verbalized understanding of teaching materials. April [name] D [name]

## 2015-10-19 NOTE — Progress Notes (Signed)
Subjective:       Patient ID: April [name] is a 35 y.o. female G1P1 presents for post partum visit  08/24/15 [redacted]w[redacted]d / 6h 27m 8 lb 8.9 oz (3.88 kg) F CSECT   Right lateral extension of incision repaired without incident  C/s arrest of descent at complete at 0 with pushing for 2.5-3 hours.    Pregnancy c/b anemia  GBS+  QFN pos, CXR pos, sputum neg X3    HPI  03/29/15 PAP NIL, HPV neg  edinburgh = 9 (#10=0)  Has supportive husband and friends, also has a church family  Denies SI or HI  occ feels worried about child's future  Declines SW eval  Bottle feeding  Desires to use condoms  No PVB  Baby doing well  tol reg diet  Normal bladder and bowel function    Review of Systems        Objective:    Physical Exam   Constitutional: She appears well-developed and well-nourished.   Neck: Normal range of motion. No thyromegaly present.   Cardiovascular: Normal rate and regular rhythm.    Pulmonary/Chest: Effort normal and breath sounds normal.   Abdominal: Soft. Bowel sounds are normal.   Incision well healed   Genitourinary: Vagina normal and uterus normal. No breast swelling, tenderness, discharge or bleeding.   Lymphadenopathy:     She has no cervical adenopathy.           Assessment:       Post partum exam       Plan:      Procedures  Nurse teaching  F/u [facility] for  prophlaxis  Next pap December 2019

## 2016-04-19 ENCOUNTER — Other Ambulatory Visit: Payer: Self-pay | Admitting: Obstetrics & Gynecology

## 2016-04-19 DIAGNOSIS — O3680X0 Pregnancy with inconclusive fetal viability, not applicable or unspecified: Secondary | ICD-10-CM

## 2016-04-24 ENCOUNTER — Encounter: Payer: Self-pay | Admitting: *Deleted

## 2016-04-24 ENCOUNTER — Other Ambulatory Visit: Payer: Self-pay | Admitting: Obstetrics & Gynecology

## 2016-04-24 ENCOUNTER — Encounter (INDEPENDENT_AMBULATORY_CARE_PROVIDER_SITE_OTHER): Payer: Self-pay

## 2016-04-24 ENCOUNTER — Other Ambulatory Visit: Payer: Self-pay

## 2016-04-24 ENCOUNTER — Ambulatory Visit (INDEPENDENT_AMBULATORY_CARE_PROVIDER_SITE_OTHER): Payer: Medicaid Other

## 2016-04-24 ENCOUNTER — Ambulatory Visit (INDEPENDENT_AMBULATORY_CARE_PROVIDER_SITE_OTHER): Payer: Medicaid Other | Admitting: *Deleted

## 2016-04-24 VITALS — BP 124/67 | HR 74 | Ht 65.0 in | Wt 179.0 lb

## 2016-04-24 DIAGNOSIS — O3680X1 Pregnancy with inconclusive fetal viability, fetus 1: Secondary | ICD-10-CM | POA: Diagnosis not present

## 2016-04-24 DIAGNOSIS — Z3682 Encounter for antenatal screening for nuchal translucency: Secondary | ICD-10-CM

## 2016-04-24 DIAGNOSIS — Z331 Pregnant state, incidental: Secondary | ICD-10-CM

## 2016-04-24 DIAGNOSIS — Z1389 Encounter for screening for other disorder: Secondary | ICD-10-CM | POA: Diagnosis not present

## 2016-04-24 DIAGNOSIS — O3680X0 Pregnancy with inconclusive fetal viability, not applicable or unspecified: Secondary | ICD-10-CM

## 2016-04-24 DIAGNOSIS — Z3481 Encounter for supervision of other normal pregnancy, first trimester: Secondary | ICD-10-CM

## 2016-04-24 DIAGNOSIS — Z3A12 12 weeks gestation of pregnancy: Secondary | ICD-10-CM | POA: Diagnosis not present

## 2016-04-24 DIAGNOSIS — O34219 Maternal care for unspecified type scar from previous cesarean delivery: Secondary | ICD-10-CM | POA: Diagnosis not present

## 2016-04-24 DIAGNOSIS — O3411 Maternal care for benign tumor of corpus uteri, first trimester: Secondary | ICD-10-CM | POA: Diagnosis not present

## 2016-04-24 DIAGNOSIS — O283 Abnormal ultrasonic finding on antenatal screening of mother: Secondary | ICD-10-CM | POA: Diagnosis not present

## 2016-04-24 DIAGNOSIS — O0990 Supervision of high risk pregnancy, unspecified, unspecified trimester: Secondary | ICD-10-CM | POA: Insufficient documentation

## 2016-04-24 DIAGNOSIS — Z98891 History of uterine scar from previous surgery: Secondary | ICD-10-CM

## 2016-04-24 LAB — POCT URINALYSIS DIPSTICK
Glucose, UA: NEGATIVE
KETONES UA: NEGATIVE
Leukocytes, UA: NEGATIVE
NITRITE UA: NEGATIVE
Protein, UA: NEGATIVE
RBC UA: NEGATIVE

## 2016-04-24 NOTE — Progress Notes (Signed)
Kayla Walker is a 36 y.o. G36P1001 female here today for initial OB intake/educational visit with RN  Patient's medical, surgical, and obstetrical history obtained and reviewed.  Current medications and allergies also reviewed.   Dating ultrasound today revealed GA of [redacted]wks based on LMP. EDC 11/06/16   BP 124/67   Pulse 74   Ht 5\' 5"  (1.651 m)   Wt 179 lb (81.2 kg)   LMP 01/31/2016   BMI 29.79 kg/m   Patient Active Problem List   Diagnosis Date Noted  . Encounter for supervision of other normal pregnancy 04/24/2016  . History of cesarean delivery 04/24/2016   Past Medical History:  Diagnosis Date  . Medical history non-contributory    Past Surgical History:  Procedure Laterality Date  . CESAREAN SECTION     OB History    Gravida Para Term Preterm AB Living   2 1 1     1    SAB TAB Ectopic Multiple Live Births           1      She is taking prenatal vitamins CCNC form completed PN1 labs drawn Baby scripts and mychart activated  Reviewed recommended weight gain based on pre-gravid BMI  Genetic Screening discussed First Screen: ordered Cystic fibrosis screening discussed declined  Face-to-face time at least 30 minutes. 50% or more of this visit was spent in counseling and coordination of care.  Return for New OB.     NT elevated.  Discussed w/pt and husband. Options (amnio, NIPS) discussed.  Chooses NIPS.  Ordered today. Attestation of supervision of visit:   Evaluation and management procedures were performed by Alice Rieger, RN,  under my direct supervision and collaboration. I have reviewed the Registered Nurse's note and chart, and I agree with the management and plan.  50% or more of this visit was spent in counseling and coordination of care.  15 minutes of face to face time.      CRESENZO-DISHMAN,FRANCES RN-C 04/24/2016 2:16 PM

## 2016-04-24 NOTE — Progress Notes (Signed)
[place] 12 wks,single IUP,pos fht 159 bpm,normal ov's bilat,NB present,abnormal NT 3.1 mm,crl 63.85mm,ant fibroid 2.8 x 1.6 x 2.8 cm

## 2016-04-25 LAB — PMP SCREEN PROFILE (10S), URINE
Amphetamine Screen, Ur: NEGATIVE ng/mL
Barbiturate Screen, Ur: NEGATIVE ng/mL
Benzodiazepine Screen, Urine: NEGATIVE ng/mL
CANNABINOIDS UR QL SCN: NEGATIVE ng/mL
COCAINE(METAB.) SCREEN, URINE: NEGATIVE ng/mL
Creatinine(Crt), U: 80.3 mg/dL (ref 20.0–300.0)
Methadone Scn, Ur: NEGATIVE ng/mL
OXYCODONE+OXYMORPHONE UR QL SCN: NEGATIVE ng/mL
Opiate Scrn, Ur: NEGATIVE ng/mL
PCP SCRN UR: NEGATIVE ng/mL
Ph of Urine: 5.7 (ref 4.5–8.9)
Propoxyphene, Screen: NEGATIVE ng/mL

## 2016-04-25 LAB — RUBELLA SCREEN: Rubella Antibodies, IGG: 2.09 index (ref >0.99)

## 2016-04-25 LAB — VARICELLA ZOSTER ANTIBODY, IGG: VARICELLA: 284 {index} (ref >165)

## 2016-04-25 LAB — URINALYSIS, ROUTINE W REFLEX MICROSCOPIC
BILIRUBIN UA: NEGATIVE
GLUCOSE, UA: NEGATIVE
Ketones, UA: NEGATIVE
Leukocytes, UA: NEGATIVE
NITRITE UA: NEGATIVE
Protein, UA: NEGATIVE
RBC UA: NEGATIVE
Specific Gravity, UA: 1.017 (ref 1.005–1.030)
UUROB: 0.2 mg/dL (ref 0.2–1.0)
pH, UA: 5.5 (ref 5.0–7.5)

## 2016-04-25 LAB — CBC
HEMATOCRIT: 40.4 % (ref 34.0–46.6)
HEMOGLOBIN: 13.3 g/dL (ref 11.1–15.9)
MCH: 28.4 pg (ref 26.6–33.0)
MCHC: 32.9 g/dL (ref 31.5–35.7)
MCV: 86 fL (ref 79–97)
Platelets: 281 10*3/uL (ref 150–379)
RBC: 4.69 x10E6/uL (ref 3.77–5.28)
RDW: 14.9 % (ref 12.3–15.4)
WBC: 6 10*3/uL (ref 3.4–10.8)

## 2016-04-25 LAB — RPR: RPR Ser Ql: NONREACTIVE

## 2016-04-25 LAB — ANTIBODY SCREEN: Antibody Screen: NEGATIVE

## 2016-04-25 LAB — HEPATITIS B SURFACE ANTIGEN: Hepatitis B Surface Ag: NEGATIVE

## 2016-04-25 LAB — SICKLE CELL SCREEN: SICKLE CELL SCREEN: NEGATIVE

## 2016-04-25 LAB — ABO/RH: Rh Factor: POSITIVE

## 2016-04-25 LAB — HIV ANTIBODY (ROUTINE TESTING W REFLEX): HIV SCREEN 4TH GENERATION: NONREACTIVE

## 2016-04-26 ENCOUNTER — Telehealth: Payer: Self-pay | Admitting: *Deleted

## 2016-04-26 ENCOUNTER — Other Ambulatory Visit: Payer: Self-pay | Admitting: Advanced Practice Midwife

## 2016-04-26 DIAGNOSIS — R8271 Bacteriuria: Secondary | ICD-10-CM | POA: Insufficient documentation

## 2016-04-26 LAB — URINE CULTURE

## 2016-04-26 LAB — GC/CHLAMYDIA PROBE AMP
Chlamydia trachomatis, NAA: NEGATIVE
NEISSERIA GONORRHOEAE BY PCR: NEGATIVE

## 2016-04-26 MED ORDER — AMPICILLIN 500 MG PO CAPS: 500.0000 mg | ORAL_CAPSULE | Freq: Four times a day (QID) | ORAL | 0 refills | Status: DC

## 2016-04-26 NOTE — Progress Notes (Signed)
Ampicillin 500mg  QID X 7 for GBS urine.

## 2016-04-30 LAB — INFORMASEQ(SM) WITH XY ANALYSIS
FETAL FRACTION (%): 9.5
GESTATIONAL AGE AT COLLECTION: 11.7 wk
PDF: 0

## 2016-05-01 ENCOUNTER — Ambulatory Visit (INDEPENDENT_AMBULATORY_CARE_PROVIDER_SITE_OTHER): Payer: Medicaid Other | Admitting: Women's Health

## 2016-05-01 ENCOUNTER — Encounter: Payer: Self-pay | Admitting: Women's Health

## 2016-05-01 VITALS — BP 131/68 | HR 77 | Wt 176.0 lb

## 2016-05-01 DIAGNOSIS — Z3481 Encounter for supervision of other normal pregnancy, first trimester: Secondary | ICD-10-CM

## 2016-05-01 DIAGNOSIS — O2341 Unspecified infection of urinary tract in pregnancy, first trimester: Secondary | ICD-10-CM

## 2016-05-01 DIAGNOSIS — Z1389 Encounter for screening for other disorder: Secondary | ICD-10-CM

## 2016-05-01 DIAGNOSIS — Z331 Pregnant state, incidental: Secondary | ICD-10-CM

## 2016-05-01 DIAGNOSIS — Z3A13 13 weeks gestation of pregnancy: Secondary | ICD-10-CM

## 2016-05-01 DIAGNOSIS — R8271 Bacteriuria: Secondary | ICD-10-CM

## 2016-05-01 DIAGNOSIS — Z98891 History of uterine scar from previous surgery: Secondary | ICD-10-CM

## 2016-05-01 DIAGNOSIS — O34219 Maternal care for unspecified type scar from previous cesarean delivery: Secondary | ICD-10-CM

## 2016-05-01 DIAGNOSIS — O283 Abnormal ultrasonic finding on antenatal screening of mother: Secondary | ICD-10-CM

## 2016-05-01 LAB — POCT URINALYSIS DIPSTICK
Glucose, UA: NEGATIVE
Ketones, UA: NEGATIVE
Leukocytes, UA: NEGATIVE
Nitrite, UA: NEGATIVE
PROTEIN UA: NEGATIVE
RBC UA: NEGATIVE

## 2016-05-01 NOTE — Patient Instructions (Signed)
Nausea & Vomiting  Have saltine crackers or pretzels by your bed and eat a few bites before you raise your head out of bed in the morning  Eat small frequent meals throughout the day instead of large meals  Drink plenty of fluids throughout the day to stay hydrated, just don't drink a lot of fluids with your meals.  This can make your stomach fill up faster making you feel sick  Do not brush your teeth right after you eat  Products with real ginger are good for nausea, like ginger ale and ginger hard candy Make sure it says made with real ginger!  Sucking on sour candy like lemon heads is also good for nausea  If your prenatal vitamins make you nauseated, take them at night so you will sleep through the nausea  Sea Bands  If you feel like you need medicine for the nausea & vomiting please let us know  If you are unable to keep any fluids or food down please let us know   Constipation  Drink plenty of fluid, preferably water, throughout the day  Eat foods high in fiber such as fruits, vegetables, and grains  Exercise, such as walking, is a good way to keep your bowels regular  Drink warm fluids, especially warm prune juice, or decaf coffee  Eat a 1/2 cup of real oatmeal (not instant), 1/2 cup applesauce, and 1/2-1 cup warm prune juice every day  If needed, you may take Colace (docusate sodium) stool softener once or twice a day to help keep the stool soft. If you are pregnant, wait until you are out of your first trimester (12-14 weeks of pregnancy)  If you still are having problems with constipation, you may take Miralax once daily as needed to help keep your bowels regular.  If you are pregnant, wait until you are out of your first trimester (12-14 weeks of pregnancy)    Second Trimester of Pregnancy The second trimester is from week 13 through week 28 (months 4 through 6). The second trimester is often a time when you feel your best. Your body has also adjusted to being  pregnant, and you begin to feel better physically. Usually, morning sickness has lessened or quit completely, you may have more energy, and you may have an increase in appetite. The second trimester is also a time when the fetus is growing rapidly. At the end of the sixth month, the fetus is about 9 inches long and weighs about 1 pounds. You will likely begin to feel the baby move (quickening) between 18 and 20 weeks of the pregnancy. Body changes during your second trimester Your body continues to go through many changes during your second trimester. The changes vary from woman to woman.  Your weight will continue to increase. You will notice your lower abdomen bulging out.  You may begin to get stretch marks on your hips, abdomen, and breasts.  You may develop headaches that can be relieved by medicines. The medicines should be approved by your health care provider.  You may urinate more often because the fetus is pressing on your bladder.  You may develop or continue to have heartburn as a result of your pregnancy.  You may develop constipation because certain hormones are causing the muscles that push waste through your intestines to slow down.  You may develop hemorrhoids or swollen, bulging veins (varicose veins).  You may have back pain. This is caused by:  Weight gain.  Pregnancy hormones that  are relaxing the joints in your pelvis.  A shift in weight and the muscles that support your balance.  Your breasts will continue to grow and they will continue to become tender.  Your gums may bleed and may be sensitive to brushing and flossing.  Dark spots or blotches (chloasma, mask of pregnancy) may develop on your face. This will likely fade after the baby is born.  A dark line from your belly button to the pubic area (linea nigra) may appear. This will likely fade after the baby is born.  You may have changes in your hair. These can include thickening of your hair, rapid growth,  and changes in texture. Some women also have hair loss during or after pregnancy, or hair that feels dry or thin. Your hair will most likely return to normal after your baby is born. What to expect at prenatal visits During a routine prenatal visit:  You will be weighed to make sure you and the fetus are growing normally.  Your blood pressure will be taken.  Your abdomen will be measured to track your baby's growth.  The fetal heartbeat will be listened to.  Any test results from the previous visit will be discussed. Your health care provider may ask you:  How you are feeling.  If you are feeling the baby move.  If you have had any abnormal symptoms, such as leaking fluid, bleeding, severe headaches, or abdominal cramping.  If you are using any tobacco products, including cigarettes, chewing tobacco, and electronic cigarettes.  If you have any questions. Other tests that may be performed during your second trimester include:  Blood tests that check for:  Low iron levels (anemia).  Gestational diabetes (between 24 and 28 weeks).  Rh antibodies. This is to check for a protein on red blood cells (Rh factor).  Urine tests to check for infections, diabetes, or protein in the urine.  An ultrasound to confirm the proper growth and development of the baby.  An amniocentesis to check for possible genetic problems.  Fetal screens for spina bifida and Down syndrome.  HIV (human immunodeficiency virus) testing. Routine prenatal testing includes screening for HIV, unless you choose not to have this test. Follow these instructions at home: Eating and drinking  Continue to eat regular, healthy meals.  Avoid raw meat, uncooked cheese, cat litter boxes, and soil used by cats. These carry germs that can cause birth defects in the baby.  Take your prenatal vitamins.  Take 1500-2000 mg of calcium daily starting at the 20th week of pregnancy until you deliver your baby.  If you  develop constipation:  Take over-the-counter or prescription medicines.  Drink enough fluid to keep your urine clear or pale yellow.  Eat foods that are high in fiber, such as fresh fruits and vegetables, whole grains, and beans.  Limit foods that are high in fat and processed sugars, such as fried and sweet foods. Activity  Exercise only as directed by your health care provider. Experiencing uterine cramps is a good sign to stop exercising.  Avoid heavy lifting, wear low heel shoes, and practice good posture.  Wear your seat belt at all times when driving.  Rest with your legs elevated if you have leg cramps or low back pain.  Wear a good support bra for breast tenderness.  Do not use hot tubs, steam rooms, or saunas. Lifestyle  Avoid all smoking, herbs, alcohol, and unprescribed drugs. These chemicals affect the formation and growth of the baby.  Do not use any products that contain nicotine or tobacco, such as cigarettes and e-cigarettes. If you need help quitting, ask your health care provider.  A sexual relationship may be continued unless your health care provider directs you otherwise. General instructions  Follow your health care provider's instructions regarding medicine use. There are medicines that are either safe or unsafe to take during pregnancy.  Take warm sitz baths to soothe any pain or discomfort caused by hemorrhoids. Use hemorrhoid cream if your health care provider approves.  If you develop varicose veins, wear support hose. Elevate your feet for 15 minutes, 3-4 times a day. Limit salt in your diet.  Visit your dentist if you have not gone yet during your pregnancy. Use a soft toothbrush to brush your teeth and be gentle when you floss.  Keep all follow-up prenatal visits as told by your health care provider. This is important. Contact a health care provider if:  You have dizziness.  You have mild pelvic cramps, pelvic pressure, or nagging pain in the  abdominal area.  You have persistent nausea, vomiting, or diarrhea.  You have a bad smelling vaginal discharge.  You have pain with urination. Get help right away if:  You have a fever.  You are leaking fluid from your vagina.  You have spotting or bleeding from your vagina.  You have severe abdominal cramping or pain.  You have rapid weight gain or weight loss.  You have shortness of breath with chest pain.  You notice sudden or extreme swelling of your face, hands, ankles, feet, or legs.  You have not felt your baby move in over an hour.  You have severe headaches that do not go away with medicine.  You have vision changes. Summary  The second trimester is from week 13 through week 28 (months 4 through 6). It is also a time when the fetus is growing rapidly.  Your body goes through many changes during pregnancy. The changes vary from woman to woman.  Avoid all smoking, herbs, alcohol, and unprescribed drugs. These chemicals affect the formation and growth your baby.  Do not use any tobacco products, such as cigarettes, chewing tobacco, and e-cigarettes. If you need help quitting, ask your health care provider.  Contact your health care provider if you have any questions. Keep all prenatal visits as told by your health care provider. This is important. This information is not intended to replace advice given to you by your health care provider. Make sure you discuss any questions you have with your health care provider. Document Released: 04/03/2001 Document Revised: 09/15/2015 Document Reviewed: 06/10/2012 Elsevier Interactive Patient Education  2017 [employer].

## 2016-05-01 NOTE — Progress Notes (Signed)
  Subjective:  Kayla Walker is a 36 y.o. G2P1001 female at [redacted]w[redacted]d by LMP c/w 12wk u/s, being seen today for her first obstetrical visit.  Her obstetrical history is significant for term c/s x 1 for arrest of descent after 2hrs pushing in [place], baby was 8lb8oz.  Thickened NT this pregnancy w/ Informaseq normal female. GBS+urine this pregnancy. Pregnancy history fully reviewed.  Patient reports no complaints. Denies vb, cramping, uti s/s, abnormal/malodorous vag d/c, or vulvovaginal itching/irritation.  BP 131/68   Pulse 77   Wt 176 lb (79.8 kg)   LMP 01/31/2016   BMI 29.29 kg/m   HISTORY: OB History  Gravida Para Term Preterm AB Living  2 1 1     1   SAB TAB Ectopic Multiple Live Births          1    # Outcome Date GA Lbr Len/2nd Weight Sex Delivery Anes PTL Lv  2 Current           1 Term 08/24/15 [redacted]w[redacted]d  8 lb 8 oz (3.856 kg) F CS-LVertical EPI N LIV     Complications: Failure to Progress in Second Stage     Past Medical History:  Diagnosis Date  . Medical history non-contributory    Past Surgical History:  Procedure Laterality Date  . CESAREAN SECTION     History reviewed. No pertinent family history.  Exam   System:     General: Well developed & nourished, no acute distress   Skin: Warm & dry, normal coloration and turgor, no rashes   Neurologic: Alert & oriented, normal mood   Cardiovascular: Regular rate & rhythm   Respiratory: Effort & rate normal, LCTAB, acyanotic   Abdomen: Soft, non tender   Extremities: normal strength, tone  Thin prep pap smear neg FHR: + via informal u/s d/t unable to hear w/ doppler   Assessment:   Pregnancy: G2P1001 Patient Active Problem List   Diagnosis Date Noted  . GBS bacteriuria 04/26/2016  . Encounter for supervision of other normal pregnancy 04/24/2016  . History of cesarean delivery 04/24/2016  . Abnormal antenatal ultrasound 04/24/2016    [redacted]w[redacted]d G2P1001 New OB visit Prev c/s Thickened NT w/ normal female Informaseq GBS+  bacteruria this pregnancy  Plan:  Initial labs obtained at intake visit, reviewed Continue prenatal vitamins Problem list reviewed and updated Reviewed n/v relief measures and warning s/s to report Reviewed recommended weight gain based on pre-gravid BMI Encouraged well-balanced diet Genetic Screening discussed Integrated Screen: requested Cystic fibrosis screening discussed declined Ultrasound discussed; fetal survey: requested Follow up in 3 weeks for visit and 2nd IT CCNC completed Urine cx for poc today Discussed TOLAC, gave consent to take home and review  Tawnya Crook CNM, [facility] 05/01/2016 9:37 AM

## 2016-05-04 LAB — URINE CULTURE

## 2016-05-22 ENCOUNTER — Ambulatory Visit (INDEPENDENT_AMBULATORY_CARE_PROVIDER_SITE_OTHER): Payer: Medicaid Other | Admitting: Obstetrics & Gynecology

## 2016-05-22 VITALS — BP 130/90 | HR 72 | Wt 178.0 lb

## 2016-05-22 DIAGNOSIS — Z3482 Encounter for supervision of other normal pregnancy, second trimester: Secondary | ICD-10-CM

## 2016-05-22 DIAGNOSIS — O283 Abnormal ultrasonic finding on antenatal screening of mother: Secondary | ICD-10-CM

## 2016-05-22 DIAGNOSIS — Z331 Pregnant state, incidental: Secondary | ICD-10-CM

## 2016-05-22 DIAGNOSIS — Z3A16 16 weeks gestation of pregnancy: Secondary | ICD-10-CM

## 2016-05-22 DIAGNOSIS — Z1389 Encounter for screening for other disorder: Secondary | ICD-10-CM

## 2016-05-22 LAB — POCT URINALYSIS DIPSTICK
Glucose, UA: NEGATIVE
KETONES UA: NEGATIVE
Leukocytes, UA: NEGATIVE
NITRITE UA: NEGATIVE
Protein, UA: NEGATIVE
RBC UA: NEGATIVE

## 2016-05-22 NOTE — Progress Notes (Signed)
G2P1001 [redacted]w[redacted]d Estimated Date of Delivery: 11/06/16  Blood pressure 130/90, pulse 72, weight 178 lb (80.7 kg), last menstrual period 01/31/2016.   BP weight and urine results all reviewed and noted.  Please refer to the obstetrical flow sheet for the fundal height and fetal heart rate documentation:  Patient reports good fetal movement, denies any bleeding and no rupture of membranes symptoms or regular contractions. Patient is without complaints. All questions were answered.  Orders Placed This Encounter  Procedures  . US OB Comp + 14 Wk  . POCT urinalysis dipstick    Plan:  Continued routine obstetrical care, thickened NT with normal cfDNA testing  Return in about 4 weeks (around 06/19/2016) for 20 week sono, LROB.

## 2016-05-28 NOTE — Telephone Encounter (Signed)
Pt seen in office 05/01/16

## 2016-06-20 ENCOUNTER — Encounter: Payer: Self-pay | Admitting: Advanced Practice Midwife

## 2016-06-20 ENCOUNTER — Other Ambulatory Visit: Payer: Self-pay

## 2016-07-02 ENCOUNTER — Ambulatory Visit (INDEPENDENT_AMBULATORY_CARE_PROVIDER_SITE_OTHER): Payer: Medicaid Other | Admitting: Obstetrics and Gynecology

## 2016-07-02 ENCOUNTER — Encounter: Payer: Self-pay | Admitting: Obstetrics and Gynecology

## 2016-07-02 ENCOUNTER — Ambulatory Visit (INDEPENDENT_AMBULATORY_CARE_PROVIDER_SITE_OTHER): Payer: Medicaid Other

## 2016-07-02 VITALS — BP 122/74 | HR 76 | Wt 178.0 lb

## 2016-07-02 DIAGNOSIS — Z3482 Encounter for supervision of other normal pregnancy, second trimester: Secondary | ICD-10-CM

## 2016-07-02 DIAGNOSIS — O3412 Maternal care for benign tumor of corpus uteri, second trimester: Secondary | ICD-10-CM

## 2016-07-02 DIAGNOSIS — Z331 Pregnant state, incidental: Secondary | ICD-10-CM

## 2016-07-02 DIAGNOSIS — Z3A22 22 weeks gestation of pregnancy: Secondary | ICD-10-CM

## 2016-07-02 DIAGNOSIS — O283 Abnormal ultrasonic finding on antenatal screening of mother: Secondary | ICD-10-CM | POA: Diagnosis not present

## 2016-07-02 DIAGNOSIS — Z3402 Encounter for supervision of normal first pregnancy, second trimester: Secondary | ICD-10-CM

## 2016-07-02 DIAGNOSIS — R8271 Bacteriuria: Secondary | ICD-10-CM

## 2016-07-02 DIAGNOSIS — Z1389 Encounter for screening for other disorder: Secondary | ICD-10-CM

## 2016-07-02 LAB — POCT URINALYSIS DIPSTICK
Glucose, UA: NEGATIVE
KETONES UA: NEGATIVE
Leukocytes, UA: NEGATIVE
Nitrite, UA: NEGATIVE
PROTEIN UA: NEGATIVE
RBC UA: NEGATIVE

## 2016-07-02 NOTE — Progress Notes (Signed)
[place] 15+0 wks,cephalic,posterior pl gr 0,normal ov's bilat,cx 3.9 cm,two ant fibroids (#1) 2.1 cm,(#2) 2.2 cm,svp of fluid 5.3 cm,fhr 153 bpm,efw 486 g,anatomy complete,no obvious abnormalities seen

## 2016-07-02 NOTE — Progress Notes (Signed)
Patient ID: Kayla Walker, female   DOB: 1981-04-13, 36 y.o.   MRN: [id number]  [id number]  Estimated Date of Delivery: 11/06/16 [facility] [redacted]w[redacted]d  Chief Complaint  Patient presents with  . Routine Prenatal Visit  ____  Patient complaints: none. She states she is not planning on breastfeeding because she had difficulty with her last baby.   Patient reports good fetal movement. She denies any bleeding, rupture of membranes,or regular contractions.  Blood pressure 122/74, pulse 76, weight 178 lb (80.7 kg), last menstrual period 01/31/2016.   Urine results:notable for none refer to the ob flow sheet for FH and FHR, ,                          Physical Examination: General appearance - alert, well appearing, and in no distress                                      Abdomen - FHR 153 bpm                                                         soft, nontender, nondistended, no masses or organomegaly                                            Questions were answered. Assessment: LROB G2P1001 @ [redacted]w[redacted]d  20 week SONO today  Plan:  Continued routine obstetrical care  F/u in 4 weeks for routine prenatal care   By signing my name below, I, Hansel Feinstein, attest that this documentation has been prepared under the direction and in the presence of Jonnie Kind, MD. Electronically Signed: Hansel Feinstein, ED Scribe. 07/02/16. 11:24 AM.  I personally performed the services described in this documentation, which was SCRIBED in my presence. The recorded information has been reviewed and considered accurate. It has been edited as necessary during review. Jonnie Kind, MD

## 2016-07-02 NOTE — Patient Instructions (Addendum)
Please check out [email or web address]   for more information on childbirth classes   [phone] is the phone number for Pregnancy Classes or hospital tours at [place] will be referred to  [email or web address] for more information on childbirth classes  At this site you may register for classes. You may sign up for a waiting list if classes are full. Please SIGN UP FOR THIS!.   When the waiting list becomes long, sometimes new classes can be added.   Consider visiting [facility] prior to delivering to familiarize yourself with the facility.

## 2016-07-03 ENCOUNTER — Encounter: Payer: Self-pay | Admitting: Advanced Practice Midwife

## 2016-07-30 ENCOUNTER — Encounter: Payer: Self-pay | Admitting: Women's Health

## 2016-07-30 ENCOUNTER — Ambulatory Visit (INDEPENDENT_AMBULATORY_CARE_PROVIDER_SITE_OTHER): Payer: Medicaid Other | Admitting: Women's Health

## 2016-07-30 VITALS — BP 118/60 | HR 84 | Wt 182.0 lb

## 2016-07-30 DIAGNOSIS — Z1389 Encounter for screening for other disorder: Secondary | ICD-10-CM

## 2016-07-30 DIAGNOSIS — O34219 Maternal care for unspecified type scar from previous cesarean delivery: Secondary | ICD-10-CM

## 2016-07-30 DIAGNOSIS — Z331 Pregnant state, incidental: Secondary | ICD-10-CM

## 2016-07-30 DIAGNOSIS — Z98891 History of uterine scar from previous surgery: Secondary | ICD-10-CM

## 2016-07-30 DIAGNOSIS — D259 Leiomyoma of uterus, unspecified: Secondary | ICD-10-CM

## 2016-07-30 DIAGNOSIS — O2342 Unspecified infection of urinary tract in pregnancy, second trimester: Secondary | ICD-10-CM

## 2016-07-30 DIAGNOSIS — Z3482 Encounter for supervision of other normal pregnancy, second trimester: Secondary | ICD-10-CM

## 2016-07-30 LAB — POCT URINALYSIS DIPSTICK
Glucose, UA: NEGATIVE
KETONES UA: NEGATIVE
Leukocytes, UA: NEGATIVE
NITRITE UA: NEGATIVE
Protein, UA: NEGATIVE
RBC UA: NEGATIVE

## 2016-07-30 NOTE — Patient Instructions (Signed)
You will have your sugar test next visit.  Please do not eat or drink anything after midnight the night before you come, not even water.  You will be here for at least two hours.     Call the office [phone]) or go to [facility] if:  You begin to have strong, frequent contractions  Your water breaks.  Sometimes it is a big gush of fluid, sometimes it is just a trickle that keeps getting your panties wet or running down your legs  You have vaginal bleeding.  It is normal to have a small amount of spotting if your cervix was checked.   You don't feel your baby moving like normal.  If you don't, get you something to eat and drink and lay down and focus on feeling your baby move.   If your baby is still not moving like normal, you should call the office or go to [place] [facility]:  [place] [phone]                 [place] [phone] (usually not accepting new patients unless you have family there already, you are always welcome to call and ask)            [facility] - Closed Pediatricians/Family Doctors:   [facility]: [phone]  [facility]: [phone]    Second Trimester of Pregnancy The second trimester is from week 13 through week 28, months 4 through 6. The second trimester is often a time when you feel your best. Your body has also adjusted to being pregnant, and you begin to feel better physically. Usually, morning sickness has lessened or quit completely, you may have more energy, and you may have an increase in appetite. The second trimester is also a time when the fetus is growing rapidly. At the end of the sixth month, the fetus is about 9 inches long and weighs about 1 pounds. You will likely begin to feel the baby move (quickening) between 18 and 20 weeks of the pregnancy. BODY CHANGES Your body goes through many changes  during pregnancy. The changes vary from woman to woman.   Your weight will continue to increase. You will notice your lower abdomen bulging out.  You may begin to get stretch marks on your hips, abdomen, and breasts.  You may develop headaches that can be relieved by medicines approved by your health care provider.  You may urinate more often because the fetus is pressing on your bladder.  You may develop or continue to have heartburn as a result of your pregnancy.  You may develop constipation because certain hormones are causing the muscles that push waste through your intestines to slow down.  You may develop hemorrhoids or swollen, bulging veins (varicose veins).  You may have back pain because of the weight gain and pregnancy hormones relaxing your joints between the bones in your pelvis and as a result of a shift in weight and the muscles that support your balance.  Your breasts will continue to grow and be tender.  Your gums may bleed and may be sensitive to brushing and flossing.  Dark spots or blotches (chloasma, mask of pregnancy) may develop on your face. This will likely fade after the baby is born.  A dark line from your belly button to the pubic area (linea nigra) may appear. This will likely fade after the baby  is born.  You may have changes in your hair. These can include thickening of your hair, rapid growth, and changes in texture. Some women also have hair loss during or after pregnancy, or hair that feels dry or thin. Your hair will most likely return to normal after your baby is born. WHAT TO EXPECT AT YOUR PRENATAL VISITS During a routine prenatal visit:  You will be weighed to make sure you and the fetus are growing normally.  Your blood pressure will be taken.  Your abdomen will be measured to track your baby's growth.  The fetal heartbeat will be listened to.  Any test results from the previous visit will be discussed. Your health care provider may ask  you:  How you are feeling.  If you are feeling the baby move.  If you have had any abnormal symptoms, such as leaking fluid, bleeding, severe headaches, or abdominal cramping.  If you have any questions. Other tests that may be performed during your second trimester include:  Blood tests that check for:  Low iron levels (anemia).  Gestational diabetes (between 24 and 28 weeks).  Rh antibodies.  Urine tests to check for infections, diabetes, or protein in the urine.  An ultrasound to confirm the proper growth and development of the baby.  An amniocentesis to check for possible genetic problems.  Fetal screens for spina bifida and Down syndrome. HOME CARE INSTRUCTIONS   Avoid all smoking, herbs, alcohol, and unprescribed drugs. These chemicals affect the formation and growth of the baby.  Follow your health care provider's instructions regarding medicine use. There are medicines that are either safe or unsafe to take during pregnancy.  Exercise only as directed by your health care provider. Experiencing uterine cramps is a good sign to stop exercising.  Continue to eat regular, healthy meals.  Wear a good support bra for breast tenderness.  Do not use hot tubs, steam rooms, or saunas.  Wear your seat belt at all times when driving.  Avoid raw meat, uncooked cheese, cat litter boxes, and soil used by cats. These carry germs that can cause birth defects in the baby.  Take your prenatal vitamins.  Try taking a stool softener (if your health care provider approves) if you develop constipation. Eat more high-fiber foods, such as fresh vegetables or fruit and whole grains. Drink plenty of fluids to keep your urine clear or pale yellow.  Take warm sitz baths to soothe any pain or discomfort caused by hemorrhoids. Use hemorrhoid cream if your health care provider approves.  If you develop varicose veins, wear support hose. Elevate your feet for 15 minutes, 3-4 times a day.  Limit salt in your diet.  Avoid heavy lifting, wear low heel shoes, and practice good posture.  Rest with your legs elevated if you have leg cramps or low back pain.  Visit your dentist if you have not gone yet during your pregnancy. Use a soft toothbrush to brush your teeth and be gentle when you floss.  A sexual relationship may be continued unless your health care provider directs you otherwise.  Continue to go to all your prenatal visits as directed by your health care provider. SEEK MEDICAL CARE IF:   You have dizziness.  You have mild pelvic cramps, pelvic pressure, or nagging pain in the abdominal area.  You have persistent nausea, vomiting, or diarrhea.  You have a bad smelling vaginal discharge.  You have pain with urination. SEEK IMMEDIATE MEDICAL CARE IF:   You have  a fever.  You are leaking fluid from your vagina.  You have spotting or bleeding from your vagina.  You have severe abdominal cramping or pain.  You have rapid weight gain or loss.  You have shortness of breath with chest pain.  You notice sudden or extreme swelling of your face, hands, ankles, feet, or legs.  You have not felt your baby move in over an hour.  You have severe headaches that do not go away with medicine.  You have vision changes. Document Released: 04/03/2001 Document Revised: 04/14/2013 Document Reviewed: 06/10/2012 [facility] Patient Information 2015 [place], [place]. This information is not intended to replace advice given to you by your health care provider. Make sure you discuss any questions you have with your health care provider.

## 2016-07-30 NOTE — Progress Notes (Signed)
Low-risk OB appointment G2P1001 [redacted]w[redacted]d Estimated Date of Delivery: 11/06/16 BP 118/60   Pulse 84   Wt 182 lb (82.6 kg)   LMP 01/31/2016   BMI 30.29 kg/m   BP, weight, and urine reviewed.  Refer to obstetrical flow sheet for FH & FHR.  Reports good fm.  Denies regular uc's, lof, vb, or uti s/s. No complaints. Hasn't decided about TOLAC vs. RCS, discussed risks/benefits of both, gave consent again to take home and review. Don't see c/s records in chart that were requested earlier in pregnancy, will request again today from [facility]. States pushed x 2hrs, c/s for arrest of descent, 8lb8oz baby.  Reviewed ptl s/s, fm. Plan:  Continue routine obstetrical care  F/U in 2wks for OB appointment and pn2 Urine cx poc today

## 2016-08-01 LAB — URINE CULTURE: Organism ID, Bacteria: NO GROWTH

## 2016-08-13 ENCOUNTER — Other Ambulatory Visit: Payer: Medicaid Other

## 2016-08-13 ENCOUNTER — Encounter: Payer: Self-pay | Admitting: Obstetrics & Gynecology

## 2016-08-13 ENCOUNTER — Ambulatory Visit (INDEPENDENT_AMBULATORY_CARE_PROVIDER_SITE_OTHER): Payer: Medicaid Other | Admitting: Obstetrics & Gynecology

## 2016-08-13 VITALS — BP 118/66 | HR 94 | Wt 182.0 lb

## 2016-08-13 DIAGNOSIS — Z131 Encounter for screening for diabetes mellitus: Secondary | ICD-10-CM

## 2016-08-13 DIAGNOSIS — Z3482 Encounter for supervision of other normal pregnancy, second trimester: Secondary | ICD-10-CM

## 2016-08-13 DIAGNOSIS — Z331 Pregnant state, incidental: Secondary | ICD-10-CM

## 2016-08-13 DIAGNOSIS — Z3A27 27 weeks gestation of pregnancy: Secondary | ICD-10-CM

## 2016-08-13 DIAGNOSIS — Z1389 Encounter for screening for other disorder: Secondary | ICD-10-CM

## 2016-08-13 LAB — POCT URINALYSIS DIPSTICK
Glucose, UA: 2
KETONES UA: NEGATIVE
Leukocytes, UA: NEGATIVE
Nitrite, UA: NEGATIVE
Protein, UA: NEGATIVE
RBC UA: NEGATIVE

## 2016-08-13 NOTE — Progress Notes (Signed)
G2P1001 [redacted]w[redacted]d Estimated Date of Delivery: 11/06/16  Blood pressure 118/66, pulse 94, weight 182 lb (82.6 kg), last menstrual period 01/31/2016.   BP weight and urine results all reviewed and noted.  Please refer to the obstetrical flow sheet for the fundal height and fetal heart rate documentation:  Patient reports good fetal movement, denies any bleeding and no rupture of membranes symptoms or regular contractions. Patient is without complaints. All questions were answered.  Orders Placed This Encounter  Procedures  . POCT urinalysis dipstick    Plan:  Continued routine obstetrical care, PN2  Return in about 3 weeks (around 09/03/2016) for LROB.

## 2016-08-14 LAB — RPR: RPR: NONREACTIVE

## 2016-08-14 LAB — CBC
HEMOGLOBIN: 12.5 g/dL (ref 11.1–15.9)
Hematocrit: 38.1 % (ref 34.0–46.6)
MCH: 27.1 pg (ref 26.6–33.0)
MCHC: 32.8 g/dL (ref 31.5–35.7)
MCV: 83 fL (ref 79–97)
PLATELETS: 264 10*3/uL (ref 150–379)
RBC: 4.62 x10E6/uL (ref 3.77–5.28)
RDW: 15.3 % (ref 12.3–15.4)
WBC: 7.7 10*3/uL (ref 3.4–10.8)

## 2016-08-14 LAB — GLUCOSE TOLERANCE, 2 HOURS W/ 1HR
GLUCOSE, 2 HOUR: 144 mg/dL (ref 65–152)
Glucose, 1 hour: 192 mg/dL — ABNORMAL HIGH (ref 65–179)
Glucose, Fasting: 79 mg/dL (ref 65–91)

## 2016-08-14 LAB — HIV ANTIBODY (ROUTINE TESTING W REFLEX): HIV Screen 4th Generation wRfx: NONREACTIVE

## 2016-08-14 LAB — ANTIBODY SCREEN: Antibody Screen: NEGATIVE

## 2016-08-16 ENCOUNTER — Encounter: Payer: Self-pay | Admitting: Women's Health

## 2016-08-16 ENCOUNTER — Other Ambulatory Visit: Payer: Self-pay | Admitting: Women's Health

## 2016-08-16 DIAGNOSIS — O24410 Gestational diabetes mellitus in pregnancy, diet controlled: Secondary | ICD-10-CM | POA: Insufficient documentation

## 2016-08-16 DIAGNOSIS — Z8632 Personal history of gestational diabetes: Secondary | ICD-10-CM | POA: Insufficient documentation

## 2016-08-17 ENCOUNTER — Telehealth: Payer: Self-pay | Admitting: Women's Health

## 2016-08-17 NOTE — Telephone Encounter (Signed)
Called pt. Notified of dx of GDM. Expect call from dietician to schedule appt to discuss diet/sugar testing. Bring log to each appt.  Roma Schanz, CNM, [facility] 08/17/2016 1:51 PM

## 2016-09-07 ENCOUNTER — Encounter: Payer: Self-pay | Admitting: Obstetrics & Gynecology

## 2016-09-07 ENCOUNTER — Ambulatory Visit (INDEPENDENT_AMBULATORY_CARE_PROVIDER_SITE_OTHER): Payer: Medicaid Other | Admitting: Obstetrics & Gynecology

## 2016-09-07 VITALS — BP 130/70 | HR 72 | Wt 187.0 lb

## 2016-09-07 DIAGNOSIS — Z3483 Encounter for supervision of other normal pregnancy, third trimester: Secondary | ICD-10-CM

## 2016-09-07 DIAGNOSIS — Z331 Pregnant state, incidental: Secondary | ICD-10-CM

## 2016-09-07 DIAGNOSIS — Z1389 Encounter for screening for other disorder: Secondary | ICD-10-CM

## 2016-09-07 LAB — POCT URINALYSIS DIPSTICK
GLUCOSE UA: NEGATIVE
Ketones, UA: NEGATIVE
Leukocytes, UA: NEGATIVE
NITRITE UA: NEGATIVE
PROTEIN UA: NEGATIVE
RBC UA: NEGATIVE

## 2016-09-07 NOTE — Progress Notes (Signed)
G2P1001 [redacted]w[redacted]d Estimated Date of Delivery: 11/06/16  Blood pressure 130/70, pulse 72, weight 187 lb (84.8 kg), last menstrual period 01/31/2016.   BP weight and urine results all reviewed and noted.  Please refer to the obstetrical flow sheet for the fundal height and fetal heart rate documentation:  Patient reports good fetal movement, denies any bleeding and no rupture of membranes symptoms or regular contractions. Patient is without complaints. All questions were answered.  Orders Placed This Encounter  Procedures  . POCT urinalysis dipstick    Plan:  Continued routine obstetrical care, breech presentation today  Return in about 2 weeks (around 09/21/2016) for [place].

## 2016-09-13 ENCOUNTER — Encounter: Payer: Medicaid Other | Attending: Obstetrics and Gynecology | Admitting: Nutrition

## 2016-09-13 VITALS — Ht 65.0 in | Wt 186.0 lb

## 2016-09-13 DIAGNOSIS — Z6830 Body mass index (BMI) 30.0-30.9, adult: Secondary | ICD-10-CM | POA: Diagnosis not present

## 2016-09-13 DIAGNOSIS — Z3A00 Weeks of gestation of pregnancy not specified: Secondary | ICD-10-CM | POA: Diagnosis not present

## 2016-09-13 DIAGNOSIS — Z713 Dietary counseling and surveillance: Secondary | ICD-10-CM | POA: Insufficient documentation

## 2016-09-13 DIAGNOSIS — O24410 Gestational diabetes mellitus in pregnancy, diet controlled: Secondary | ICD-10-CM | POA: Diagnosis present

## 2016-09-13 NOTE — Patient Instructions (Addendum)
Goals Follow Gestational Meal Plan Test blood sugars before breakfast and 1 hr after each meal and bedtime. Goals60-90 before breakfast and 1 hr after meal less than 120 mg/dl. Drink water Exercise 30 minutes a day. Notify MD if BS are higher than expected Bring meter and BS log to all visits.

## 2016-09-13 NOTE — Progress Notes (Signed)
Diabetes Self-Management Education  Visit Type: First/Initial  Appt. Start Time: 1400 Appt. End Time: 2956  09/13/2016  [name], identified by name and date of birth, is a 36 y.o. female with a diagnosis of Diabetes: Gestational Diabetes.  GTT 2 hr BS  192 mg/dl. EDU November 06 2016. 10 lbs weight gain. G2 P 1 Previus gilr weighed 9 lbs with C section.  ASSESSMENT  Height 5\' 5"  (1.651 m), weight 186 lb (84.4 kg), last menstrual period 01/31/2016. Body mass index is 30.95 kg/m.      Diabetes Self-Management Education - 09/13/16 1608      Visit Information   Visit Type First/Initial     Initial Visit   Diabetes Type Gestational Diabetes   Are you currently following a meal plan? No   Are you taking your medications as prescribed? No   Date Diagnosed Arp[place] 2018     Health Coping   How would you rate your overall health? Good     Psychosocial Assessment   Patient Belief/Attitude about Diabetes Motivated to manage diabetes   Self-care barriers None   Self-management support Doctor's office   Patient Concerns Nutrition/Meal planning;Monitoring;Healthy Lifestyle;Glycemic Control   Special Needs None   Preferred Learning Style No preference indicated   Learning Readiness Ready   How often do you need to have someone help you when you read instructions, pamphlets, or other written materials from your doctor or pharmacy? 1 - Never   What is the last grade level you completed in school? 12     Pre-Education Assessment   Patient understands the diabetes disease and treatment process. Needs Instruction   Patient understands incorporating nutritional management into lifestyle. Needs Instruction   Patient undertands incorporating physical activity into lifestyle. Needs Instruction   Patient understands using medications safely. Needs Instruction   Patient understands monitoring blood glucose, interpreting and using results Needs Instruction   Patient understands prevention,  detection, and treatment of acute complications. Needs Instruction   Patient understands prevention, detection, and treatment of chronic complications. Needs Instruction   Patient understands how to develop strategies to address psychosocial issues. Needs Instruction   Patient understands how to develop strategies to promote health/change behavior. Needs Instruction     Dietary Intake   Breakfast Oatmeal plain, little sugar, milk,   Snack (morning) coffee or tea   Lunch Injera 1/2 slice, meat and vegetables, water,    Dinner Rice, 1/2, veggies-broccoli, carrots and potatoes 1/2 potato,  Sparkling water   Beverage(s) water     Exercise   How many days per week to you exercise? 0     Patient Education   Disease state  Explored patient's options for treatment of their diabetes;Factors that contribute to the development of diabetes   Nutrition management  Role of diet in the treatment of diabetes and the relationship between the three main macronutrients and blood glucose level;Food label reading, portion sizes and measuring food.;Carbohydrate counting;Meal options for control of blood glucose level and chronic complications.   Preconception care Pregnancy and GDM  Role of pre-pregnancy blood glucose control on the development of the fetus;Reviewed with patient blood glucose goals with pregnancy;Role of family planning for patients with diabetes     Individualized Goals (developed by patient)   Nutrition Follow meal plan discussed;General guidelines for healthy choices and portions discussed   Physical Activity Exercise 3-5 times per week;30 minutes per day   Medications Not Applicable   Monitoring  test my blood glucose as discussed;test blood  glucose pre and post meals as discussed   Reducing Risk examine blood glucose patterns     Post-Education Assessment   Patient understands the diabetes disease and treatment process. Needs Review   Patient understands incorporating nutritional  management into lifestyle. Needs Review   Patient undertands incorporating physical activity into lifestyle. Needs Review   Patient understands using medications safely. Needs Review   Patient understands monitoring blood glucose, interpreting and using results Needs Review   Patient understands prevention, detection, and treatment of acute complications. Needs Review   Patient understands prevention, detection, and treatment of chronic complications. Needs Review   Patient understands how to develop strategies to address psychosocial issues. Needs Review   Patient understands how to develop strategies to promote health/change behavior. Needs Review     Outcomes   Expected Outcomes Demonstrated interest in learning. Expect positive outcomes   Future DMSE --  7 dyas   Program Status Completed      Individualized Plan for Diabetes Self-Management Training:   Learning Objective:  Patient will have a greater understanding of diabetes self-management. Patient education plan is to attend individual and/or group sessions per assessed needs and concerns.  Blood Glucose Monitoring Instruction   Assessment:  Primary concerns today: Patient here for instruction on Blood Glucose Monitoring. They did not  have their own meter at this time.  Meter Provided: Yes   If Yes, Brand: Accucheck Guide  BS was 83 mg/dl upon her testing her BS in office after training.   Medications: none     Intervention:    Explained rationale of testing BG to obtain data as to how their diabetes is being managed.  Provided Target Ranges for both pre and post meals  Explained factors that effect BG including food (carbohydrate), stress, activity level and insulin availability in the body including diabetes medications  Taught patient techniques for using BG monitor and lancing device  Discussed need for Rx for strips and lancets   Explained rationale of recording BG both for patient and MD to assess patterns  as needed.  Follow Up: Patient offered follow up as needed. Plan:   Goals Follow Gestational Meal Plan Test blood sugars before breakfast and 1 hr after each meal and bedtime. Goals60-90 before breakfast and 1 hr after meal less than 120 mg/dl. Drink water Exercise 30 minutes a day. Notify MD if BS are higher than expected Bring meter and BS log to all visits.  Expected Outcomes:  Demonstrated interest in learning. Expect positive outcomes  Education material provided: Meal plan card, My Plate and Carbohydrate counting sheet Gestational DM Packet and handouts.  If problems or questions, patient to contact team via:  Phone and Email  Future DSME appointment:  (7 dyas)

## 2016-09-14 ENCOUNTER — Telehealth: Payer: Self-pay | Admitting: Obstetrics & Gynecology

## 2016-09-14 NOTE — Telephone Encounter (Signed)
Pt needs strips and lancets for Accuchek Guide. I called into pharmacy but pharmacist stated Medicaid won't pay for that but will pay for Accucheck Avia. Order given for Accuchek Avia, strips and lancets with prn refills per Dr. Elonda Husky. Pt also needed Prenatal Vits. Prenatal Plus # 30 1 daily with prn refills given per Dr. Elonda Husky. [place]

## 2016-09-14 NOTE — Telephone Encounter (Signed)
Patient called stating that she would like a cal back from the nurse because she is needing a refill of her prenatal vitamins and also she has gone to the Diabetes class and needs medication for this as well. Please contact pt

## 2016-09-21 ENCOUNTER — Encounter: Payer: Self-pay | Admitting: Obstetrics & Gynecology

## 2016-09-21 ENCOUNTER — Ambulatory Visit (INDEPENDENT_AMBULATORY_CARE_PROVIDER_SITE_OTHER): Payer: Medicaid Other | Admitting: Obstetrics & Gynecology

## 2016-09-21 VITALS — BP 112/80 | HR 72 | Wt 188.0 lb

## 2016-09-21 DIAGNOSIS — Z331 Pregnant state, incidental: Secondary | ICD-10-CM

## 2016-09-21 DIAGNOSIS — Z1389 Encounter for screening for other disorder: Secondary | ICD-10-CM

## 2016-09-21 DIAGNOSIS — Z3483 Encounter for supervision of other normal pregnancy, third trimester: Secondary | ICD-10-CM

## 2016-09-21 DIAGNOSIS — Z98891 History of uterine scar from previous surgery: Secondary | ICD-10-CM

## 2016-09-21 LAB — POCT URINALYSIS DIPSTICK
Blood, UA: NEGATIVE
GLUCOSE UA: NEGATIVE
KETONES UA: NEGATIVE
Leukocytes, UA: NEGATIVE
Nitrite, UA: NEGATIVE
Protein, UA: NEGATIVE

## 2016-09-21 NOTE — Progress Notes (Signed)
G2P1001 [redacted]w[redacted]d Estimated Date of Delivery: 11/06/16  Blood pressure 112/80, pulse 72, weight 188 lb (85.3 kg), last menstrual period 01/31/2016.   BP weight and urine results all reviewed and noted.  Please refer to the obstetrical flow sheet for the fundal height and fetal heart rate documentation:  Patient reports good fetal movement, denies any bleeding and no rupture of membranes symptoms or regular contractions. Patient is without complaints. All questions were answered.  Orders Placed This Encounter  Procedures  . POCT urinalysis dipstick    Plan:  Continued routine obstetrical care, pt declines trial of labor, repeat section due to risk of uterine rupture  Return in about 2 weeks (around 10/05/2016) for LROB.

## 2016-09-27 ENCOUNTER — Encounter: Payer: Medicaid Other | Attending: Obstetrics and Gynecology | Admitting: Nutrition

## 2016-09-27 VITALS — Ht 65.0 in | Wt 189.0 lb

## 2016-09-27 DIAGNOSIS — Z3A00 Weeks of gestation of pregnancy not specified: Secondary | ICD-10-CM | POA: Diagnosis not present

## 2016-09-27 DIAGNOSIS — O24410 Gestational diabetes mellitus in pregnancy, diet controlled: Secondary | ICD-10-CM | POA: Diagnosis not present

## 2016-09-27 NOTE — Progress Notes (Signed)
Diabetes Self-Management Education  Visit Type:  Follow-up  Appt. Start Time: 1530  Appt. End Time: 1600  09/27/2016  Kayla Walker, identified by name and date of birth, is a 36 y.o. female with a diagnosis of Diabetes: Gestational Diabetes.  She notes she may do a C section July 10th.  BS logs brought in look great. FBS less than 90. 1 hr less than 140 99% of the time. Eating 3 meals per day. Feels good. No low blood sugars or high blood sugars. Doing well modifying diet to control blood sugars. Actively walking .  ASSESSMENT  Height 5\' 5"  (1.651 m), weight 189 lb (85.7 kg), last menstrual period 01/31/2016. Body mass index is 31.45 kg/m.       Diabetes Self-Management Education - 09/27/16 1626      Dietary Intake   Breakfast 1 ww toast, 1 egg and some potato, water   Lunch rice, beef, apple, vegetables, Coffee and water   Dinner Injera, lentils,    Snack (evening) banana and apple   Beverage(s) water, ocffee     Exercise   Exercise Type Light (walking / raking leaves)   How many days per week to you exercise? 30   How many minutes per day do you exercise? 0   Total minutes per week of exercise 0      Learning Objective:  Patient will have a greater understanding of diabetes self-management. Patient education plan is to attend individual and/or group sessions per assessed needs and concerns.   Plan:   Patient Instructions  Keep up the great job Eat 2-3 carb choices per meal Add snack with protein between meals.-cheese and fruit or nuts/fruit or yogurt Keep walking. Test  Blood sugar 2 hours after meals instead of 1 hr.     Expected Outcomes:     Education material provided: My Plate and Carbohydrate counting sheet  If problems or questions, patient to contact team via:  Phone and Email  Future DSME appointment: -

## 2016-09-27 NOTE — Patient Instructions (Signed)
Keep up the great job Eat 2-3 carb choices per meal Add snack with protein between meals.-cheese and fruit or nuts/fruit or yogurt Keep walking. Test  Blood sugar 2 hours after meals instead of 1 hr.

## 2016-10-04 ENCOUNTER — Encounter: Payer: Self-pay | Admitting: Obstetrics and Gynecology

## 2016-10-04 ENCOUNTER — Other Ambulatory Visit: Payer: Self-pay | Admitting: Obstetrics and Gynecology

## 2016-10-04 ENCOUNTER — Ambulatory Visit (INDEPENDENT_AMBULATORY_CARE_PROVIDER_SITE_OTHER): Payer: Medicaid Other | Admitting: Obstetrics and Gynecology

## 2016-10-04 VITALS — BP 114/60 | HR 89 | Wt 190.0 lb

## 2016-10-04 DIAGNOSIS — O0993 Supervision of high risk pregnancy, unspecified, third trimester: Secondary | ICD-10-CM

## 2016-10-04 DIAGNOSIS — Z331 Pregnant state, incidental: Secondary | ICD-10-CM

## 2016-10-04 DIAGNOSIS — Z98891 History of uterine scar from previous surgery: Secondary | ICD-10-CM

## 2016-10-04 DIAGNOSIS — Z1389 Encounter for screening for other disorder: Secondary | ICD-10-CM

## 2016-10-04 LAB — POCT URINALYSIS DIPSTICK
Blood, UA: NEGATIVE
GLUCOSE UA: NEGATIVE
Ketones, UA: NEGATIVE
LEUKOCYTES UA: NEGATIVE
NITRITE UA: NEGATIVE
PROTEIN UA: NEGATIVE

## 2016-10-04 NOTE — Progress Notes (Signed)
Patient ID: Kayla Walker, female   DOB: 25-Aug-1980, 36 y.o.   MRN: [id number]  [id number]  Estimated Date of Delivery: 11/06/16 LROB [redacted]w[redacted]d previous cesarean section 1 now declining trial of labor  Chief Complaint  Patient presents with  . Routine Prenatal Visit  ____Patient desires scheduling of C-section, scheduled for 39 weeks  Patient complaints: none.  Patient reports good fetal movement. She denies any bleeding, rupture of membranes,or regular contractions.  Blood pressure 114/60, pulse 89, weight 190 lb (86.2 kg), last menstrual period 01/31/2016.   Urine results:notable for none refer to the ob flow sheet for FH and FHR,                           Physical Examination: General appearance - alert, well appearing, and in no distress                                      Abdomen - FH 36 cm                                                        -FHR 141 bpm                                                         soft, nontender, nondistended, no masses or organomegaly                                            Questions were answered. Assessment: LROB G2P1001 @ [redacted]w[redacted]d   Plan:  Continued routine obstetrical care, repeat c/s @39w  10/30/16 @ 1045  F/u in 1 week for routine prenatal care   By signing my name below, I, Hansel Feinstein, attest that this documentation has been prepared under the direction and in the presence of Jonnie Kind, MD. Electronically Signed: Hansel Feinstein, ED Scribe. 10/04/16. 4:20 PM.  I personally performed the services described in this documentation, which was SCRIBED in my presence. The recorded information has been reviewed and considered accurate. It has been edited as necessary during review. Jonnie Kind, MD

## 2016-10-10 ENCOUNTER — Ambulatory Visit (INDEPENDENT_AMBULATORY_CARE_PROVIDER_SITE_OTHER): Payer: Medicaid Other | Admitting: Obstetrics and Gynecology

## 2016-10-10 ENCOUNTER — Encounter: Payer: Self-pay | Admitting: Obstetrics and Gynecology

## 2016-10-10 VITALS — BP 118/78 | HR 86 | Wt 191.0 lb

## 2016-10-10 DIAGNOSIS — Z331 Pregnant state, incidental: Secondary | ICD-10-CM

## 2016-10-10 DIAGNOSIS — Z1389 Encounter for screening for other disorder: Secondary | ICD-10-CM

## 2016-10-10 DIAGNOSIS — Z3483 Encounter for supervision of other normal pregnancy, third trimester: Secondary | ICD-10-CM

## 2016-10-10 DIAGNOSIS — Z98891 History of uterine scar from previous surgery: Secondary | ICD-10-CM

## 2016-10-10 DIAGNOSIS — O0993 Supervision of high risk pregnancy, unspecified, third trimester: Secondary | ICD-10-CM

## 2016-10-10 LAB — POCT URINALYSIS DIPSTICK
Blood, UA: NEGATIVE
GLUCOSE UA: NEGATIVE
KETONES UA: NEGATIVE
Leukocytes, UA: NEGATIVE
Nitrite, UA: NEGATIVE
Protein, UA: NEGATIVE

## 2016-10-10 NOTE — Progress Notes (Signed)
Patient ID: Kayla Walker, female   DOB: 05/03/1980, 36 y.o.   MRN: [id number]  [id number]  Estimated Date of Delivery: 11/06/16 LROB [redacted]w[redacted]d previous c/s x1 declines TOLAC  Chief Complaint  Patient presents with  . Routine Prenatal Visit  ____  Patient complaints: none. She is not sure what birth control method she wants to use.   Patient reports good fetal movement. She denies any bleeding, rupture of membranes,or regular contractions.  Blood pressure 118/78, pulse 86, weight 191 lb (86.6 kg), last menstrual period 01/31/2016.   Urine results:notable for none refer to the ob flow sheet for FH and FHR,                           Physical Examination: General appearance - alert, well appearing, and in no distress                                      Abdomen - FH 39 cm                                                        -FHR 136 bpm                                                         soft, nontender, nondistended, no masses or organomegaly                                               Questions were answered. Assessment: LROB G2P1001 @ [redacted]w[redacted]d   Plan:  Continued routine obstetrical care, C/s scheduled @39w  10/30/16 @1045   F/u in 1 week for routine prenatal care, GBS   By signing my name below, I, Hansel Feinstein, attest that this documentation has been prepared under the direction and in the presence of Jonnie Kind, MD. Electronically Signed: Hansel Feinstein, ED Scribe. 10/10/16. 2:01 PM.  I personally performed the services described in this documentation, which was SCRIBED in my presence. The recorded information has been reviewed and considered accurate. It has been edited as necessary during review. Jonnie Kind, MD

## 2016-10-12 ENCOUNTER — Other Ambulatory Visit: Payer: Self-pay | Admitting: Obstetrics and Gynecology

## 2016-10-15 ENCOUNTER — Telehealth (HOSPITAL_COMMUNITY): Payer: Self-pay | Admitting: *Deleted

## 2016-10-15 NOTE — Telephone Encounter (Signed)
Preadmission screen  

## 2016-10-16 ENCOUNTER — Encounter (HOSPITAL_COMMUNITY): Payer: Self-pay

## 2016-10-17 ENCOUNTER — Encounter: Payer: Self-pay | Admitting: Obstetrics and Gynecology

## 2016-10-17 ENCOUNTER — Ambulatory Visit (INDEPENDENT_AMBULATORY_CARE_PROVIDER_SITE_OTHER): Payer: Medicaid Other | Admitting: Obstetrics and Gynecology

## 2016-10-17 VITALS — BP 108/60 | HR 90 | Wt 194.4 lb

## 2016-10-17 DIAGNOSIS — Z1389 Encounter for screening for other disorder: Secondary | ICD-10-CM

## 2016-10-17 DIAGNOSIS — Z3A37 37 weeks gestation of pregnancy: Secondary | ICD-10-CM

## 2016-10-17 DIAGNOSIS — O0993 Supervision of high risk pregnancy, unspecified, third trimester: Secondary | ICD-10-CM

## 2016-10-17 DIAGNOSIS — Z331 Pregnant state, incidental: Secondary | ICD-10-CM

## 2016-10-17 DIAGNOSIS — O0990 Supervision of high risk pregnancy, unspecified, unspecified trimester: Secondary | ICD-10-CM

## 2016-10-17 LAB — POCT URINALYSIS DIPSTICK
Glucose, UA: NEGATIVE
KETONES UA: NEGATIVE
Nitrite, UA: NEGATIVE
PROTEIN UA: NEGATIVE
RBC UA: NEGATIVE

## 2016-10-17 NOTE — Progress Notes (Signed)
G2P1001  Estimated Date of Delivery: 11/06/16 LROB [redacted]w[redacted]d  Chief Complaint  Patient presents with   Routine Prenatal Visit    Gc, Chl  ____  Patient complaints: none at this time Patient reports good fetal movement,                           denies any bleeding , rupture of membranes,or regular contractions.  Blood pressure 108/60, pulse 90, weight 194 lb 6.4 oz (88.2 kg), last menstrual period 01/31/2016.   Urine results:notable for 2+ leukocytes  refer to the ob flow sheet for FH and FHR, ,                          Physical Examination: General appearance - alert, well appearing, and in no distress                                      Abdomen - FH 38 cm                                                         -FHR 134                                                         soft, nontender, nondistended, no masses or organomegaly                                      Pelvic -  GC/Chlamydia collected today                                             Questions were answered. Assessment: LROB G2P1001 @ [redacted]w[redacted]d Estimated Date of Delivery: 11/06/16   Plan:  Continued routine obstetrical care,   F/u in 1 weeks for LROB  By signing my name below, I, Sonum Patel, attest that this documentation has been prepared under the direction and in the presence of Jonnie Kind, MD. Electronically Signed: Sonum Patel, Scribe. 10/17/16. 1:39 PM.  I personally performed the services described in this documentation, which was SCRIBED in my presence. The recorded information has been reviewed and considered accurate. It has been edited as necessary during review. Jonnie Kind, MD

## 2016-10-19 LAB — GC/CHLAMYDIA PROBE AMP
CHLAMYDIA, DNA PROBE: NEGATIVE
NEISSERIA GONORRHOEAE BY PCR: NEGATIVE

## 2016-10-25 ENCOUNTER — Encounter: Payer: Self-pay | Admitting: Obstetrics and Gynecology

## 2016-10-25 ENCOUNTER — Ambulatory Visit (INDEPENDENT_AMBULATORY_CARE_PROVIDER_SITE_OTHER): Payer: Medicaid Other | Admitting: Obstetrics and Gynecology

## 2016-10-25 ENCOUNTER — Other Ambulatory Visit: Payer: Self-pay | Admitting: Obstetrics and Gynecology

## 2016-10-25 VITALS — BP 124/78 | HR 76 | Wt 197.4 lb

## 2016-10-25 DIAGNOSIS — Z331 Pregnant state, incidental: Secondary | ICD-10-CM

## 2016-10-25 DIAGNOSIS — O0990 Supervision of high risk pregnancy, unspecified, unspecified trimester: Secondary | ICD-10-CM | POA: Diagnosis not present

## 2016-10-25 DIAGNOSIS — Z3A38 38 weeks gestation of pregnancy: Secondary | ICD-10-CM

## 2016-10-25 DIAGNOSIS — O34219 Maternal care for unspecified type scar from previous cesarean delivery: Secondary | ICD-10-CM

## 2016-10-25 DIAGNOSIS — Z1389 Encounter for screening for other disorder: Secondary | ICD-10-CM

## 2016-10-25 LAB — POCT URINALYSIS DIPSTICK
Blood, UA: NEGATIVE
Glucose, UA: NEGATIVE
KETONES UA: NEGATIVE
Leukocytes, UA: NEGATIVE
Nitrite, UA: NEGATIVE
PROTEIN UA: NEGATIVE

## 2016-10-25 NOTE — Patient Instructions (Signed)
18 Shaletta Nachtigal  10/25/2016   Your procedure is scheduled on:  10/30/2016  Enter through the Main Entrance of [facility] at [place] up the phone at the desk and dial [phone].   Call this number if you have problems the morning of surgery: [phone]   Remember:   Do not eat food:  After Midnight.  Do not drink clear liquids: After Midnight.  Take these medicines the morning of surgery with A SIP OF WATER: N/A   Do not wear jewelry, make-up or nail polish.  Do not wear lotions, powders, or perfumes. Do not wear deodorant.  Do not shave 48 hours prior to surgery.  Do not bring valuables to the hospital.  [facility] is not   responsible for any belongings or valuables brought to the hospital.  Contacts, dentures or bridgework may not be worn into surgery.  Leave suitcase in the car. After surgery it may be brought to your room.  For patients admitted to the hospital, checkout time is 11:00 AM the day of              discharge.   Patients discharged the day of surgery will not be allowed to drive             home.  Name and phone number of your driver: N/A  Special Instructions:   N/A   Please read over the following fact sheets that you were given:   Surgical Site Infection Prevention

## 2016-10-25 NOTE — Progress Notes (Signed)
G2P1001  Estimated Date of Delivery: 11/06/16 LROB [redacted]w[redacted]d repeat c/s scheduled for 39 wk by JVF  Chief Complaint  Patient presents with  . High Risk Gestation    back pain  ____  Patient complaints: none at this time. Patient reports   good fetal movement,                           denies any bleeding , rupture of membranes,or regular contractions.  Blood pressure 124/78, pulse 76, weight 197 lb 6.4 oz (89.5 kg), last menstrual period 01/31/2016.   Urine results: negative  refer to the ob flow sheet for FH and FHR, ,                          Physical Examination: General appearance - alert, well appearing, and in no distress                                      Abdomen - FH 39 ,                                                         -FHR 132                                                                                               Pelvic - examination not indicated                                            Questions were answered. Assessment: LROB G2P1001 @ [redacted]w[redacted]d Estimated Date of Delivery: 11/06/16  Plan:  Continued routine obstetrical care, repeat c-section scheduled for 10/30/16   F/u in 2 weeks for post partum visit   By signing my name below, I, Sonum Patel, attest that this documentation has been prepared under the direction and in the presence of Jonnie Kind, MD. Electronically Signed: Sonum Patel, Education administrator. 10/25/16. 2:15 PM.  I personally performed the services described in this documentation, which was SCRIBED in my presence. The recorded information has been reviewed and considered accurate. It has been edited as necessary during review. Jonnie Kind, MD

## 2016-10-26 ENCOUNTER — Encounter (HOSPITAL_COMMUNITY): Payer: Self-pay | Admitting: *Deleted

## 2016-10-26 ENCOUNTER — Inpatient Hospital Stay (HOSPITAL_COMMUNITY): Payer: Medicaid Other | Admitting: Anesthesiology

## 2016-10-26 ENCOUNTER — Encounter (HOSPITAL_COMMUNITY)
Admission: AD | Disposition: A | Discharge status: Home / Self Care | Payer: Self-pay | Source: Ambulatory Visit | Attending: Obstetrics and Gynecology

## 2016-10-26 ENCOUNTER — Inpatient Hospital Stay (HOSPITAL_COMMUNITY)
Admission: AD | Admit: 2016-10-26 | Discharge: 2016-10-29 | DRG: 766 | Disposition: A | Discharge status: Home / Self Care | Payer: Medicaid Other | Source: Ambulatory Visit | Attending: Obstetrics and Gynecology | Admitting: Obstetrics and Gynecology

## 2016-10-26 DIAGNOSIS — O24420 Gestational diabetes mellitus in childbirth, diet controlled: Secondary | ICD-10-CM | POA: Diagnosis present

## 2016-10-26 DIAGNOSIS — O99824 Streptococcus B carrier state complicating childbirth: Secondary | ICD-10-CM | POA: Diagnosis present

## 2016-10-26 DIAGNOSIS — O3413 Maternal care for benign tumor of corpus uteri, third trimester: Secondary | ICD-10-CM | POA: Diagnosis present

## 2016-10-26 DIAGNOSIS — Z3A38 38 weeks gestation of pregnancy: Secondary | ICD-10-CM

## 2016-10-26 DIAGNOSIS — D259 Leiomyoma of uterus, unspecified: Secondary | ICD-10-CM | POA: Diagnosis present

## 2016-10-26 DIAGNOSIS — Z9889 Other specified postprocedural states: Secondary | ICD-10-CM

## 2016-10-26 DIAGNOSIS — O4292 Full-term premature rupture of membranes, unspecified as to length of time between rupture and onset of labor: Secondary | ICD-10-CM | POA: Diagnosis present

## 2016-10-26 DIAGNOSIS — O34211 Maternal care for low transverse scar from previous cesarean delivery: Secondary | ICD-10-CM | POA: Diagnosis present

## 2016-10-26 DIAGNOSIS — O24419 Gestational diabetes mellitus in pregnancy, unspecified control: Secondary | ICD-10-CM

## 2016-10-26 LAB — CBC
HEMATOCRIT: 36.9 % (ref 36.0–46.0)
Hemoglobin: 12.2 g/dL (ref 12.0–15.0)
MCH: 26.9 pg (ref 26.0–34.0)
MCHC: 33.1 g/dL (ref 30.0–36.0)
MCV: 81.3 fL (ref 78.0–100.0)
Platelets: 226 10*3/uL (ref 150–400)
RBC: 4.54 MIL/uL (ref 3.87–5.11)
RDW: 16 % — AB (ref 11.5–15.5)
WBC: 7.8 10*3/uL (ref 4.0–10.5)

## 2016-10-26 LAB — COMPREHENSIVE METABOLIC PANEL
ALBUMIN: 3 g/dL — AB (ref 3.5–5.0)
ALK PHOS: 183 U/L — AB (ref 38–126)
ALT: 18 U/L (ref 14–54)
AST: 22 U/L (ref 15–41)
Anion gap: 9 (ref 5–15)
BILIRUBIN TOTAL: 0.4 mg/dL (ref 0.3–1.2)
BUN: 11 mg/dL (ref 6–20)
CALCIUM: 8.8 mg/dL — AB (ref 8.9–10.3)
CO2: 20 mmol/L — AB (ref 22–32)
CREATININE: 0.45 mg/dL (ref 0.44–1.00)
Chloride: 106 mmol/L (ref 101–111)
GFR calc Af Amer: >60 mL/min (ref >60)
GFR calc non Af Amer: >60 mL/min (ref >60)
GLUCOSE: 93 mg/dL (ref 65–99)
Potassium: 3.9 mmol/L (ref 3.5–5.1)
Sodium: 135 mmol/L (ref 135–145)
TOTAL PROTEIN: 6.9 g/dL (ref 6.5–8.1)

## 2016-10-26 LAB — RPR: RPR Ser Ql: NONREACTIVE

## 2016-10-26 LAB — TYPE AND SCREEN
ABO/RH(D): A POS
Antibody Screen: NEGATIVE

## 2016-10-26 LAB — GLUCOSE, CAPILLARY
GLUCOSE-CAPILLARY: 93 mg/dL (ref 65–99)
GLUCOSE-CAPILLARY: 74 mg/dL (ref 65–99)

## 2016-10-26 LAB — ABO/RH: ABO/RH(D): A POS

## 2016-10-26 LAB — AMNISURE RUPTURE OF MEMBRANE (ROM) NOT AT ARMC: Amnisure ROM: POSITIVE

## 2016-10-26 SURGERY — Surgical Case
Anesthesia: Spinal

## 2016-10-26 MED ORDER — DIBUCAINE 1 % RE OINT: 1.0000 "application " | TOPICAL_OINTMENT | RECTAL | Status: DC | PRN

## 2016-10-26 MED ORDER — SIMETHICONE 80 MG PO CHEW
80.0000 mg | CHEWABLE_TABLET | ORAL | Status: DC
  Administered 2016-10-27 – 2016-10-28 (×3): 80 mg via ORAL
  Filled 2016-10-26 (×3): qty 1

## 2016-10-26 MED ORDER — MENTHOL 3 MG MT LOZG: 1.0000 | LOZENGE | OROMUCOSAL | Status: DC | PRN

## 2016-10-26 MED ORDER — LACTATED RINGERS IV SOLN
INTRAVENOUS | Status: DC
  Administered 2016-10-26: 03:00:00 via INTRAVENOUS

## 2016-10-26 MED ORDER — HYDROMORPHONE HCL 1 MG/ML IJ SOLN: 0.2500 mg | INTRAMUSCULAR | Status: DC | PRN

## 2016-10-26 MED ORDER — FENTANYL CITRATE (PF) 100 MCG/2ML IJ SOLN
INTRAMUSCULAR | Status: AC
  Filled 2016-10-26: qty 2

## 2016-10-26 MED ORDER — MORPHINE SULFATE (PF) 0.5 MG/ML IJ SOLN
INTRAMUSCULAR | Status: AC
  Filled 2016-10-26: qty 10

## 2016-10-26 MED ORDER — SIMETHICONE 80 MG PO CHEW: 80.0000 mg | CHEWABLE_TABLET | ORAL | Status: DC | PRN

## 2016-10-26 MED ORDER — FENTANYL CITRATE (PF) 100 MCG/2ML IJ SOLN
100.0000 ug | Freq: Once | INTRAMUSCULAR | Status: AC
  Administered 2016-10-26: 100 ug via INTRAVENOUS
  Filled 2016-10-26: qty 2

## 2016-10-26 MED ORDER — LACTATED RINGERS IV SOLN: 125.0000 mL/h | INTRAVENOUS | Status: DC

## 2016-10-26 MED ORDER — OXYCODONE-ACETAMINOPHEN 5-325 MG PO TABS: 2.0000 | ORAL_TABLET | ORAL | Status: DC | PRN

## 2016-10-26 MED ORDER — ONDANSETRON HCL 4 MG/2ML IJ SOLN
INTRAMUSCULAR | Status: DC | PRN
  Administered 2016-10-26: 4 mg via INTRAVENOUS

## 2016-10-26 MED ORDER — OXYTOCIN 10 UNIT/ML IJ SOLN
INTRAMUSCULAR | Status: AC
  Filled 2016-10-26: qty 4

## 2016-10-26 MED ORDER — SCOPOLAMINE 1 MG/3DAYS TD PT72
MEDICATED_PATCH | TRANSDERMAL | Status: DC | PRN
  Administered 2016-10-26: 1 via TRANSDERMAL

## 2016-10-26 MED ORDER — PROMETHAZINE HCL 25 MG/ML IJ SOLN: 6.2500 mg | INTRAMUSCULAR | Status: DC | PRN

## 2016-10-26 MED ORDER — PHENYLEPHRINE 8 MG IN D5W 100 ML (0.08MG/ML) PREMIX OPTIME
INJECTION | INTRAVENOUS | Status: DC | PRN
  Administered 2016-10-26: 60 ug/min via INTRAVENOUS

## 2016-10-26 MED ORDER — SOD CITRATE-CITRIC ACID 500-334 MG/5ML PO SOLN
ORAL | Status: AC
  Filled 2016-10-26: qty 15

## 2016-10-26 MED ORDER — FAMOTIDINE IN NACL 20-0.9 MG/50ML-% IV SOLN
INTRAVENOUS | Status: AC
  Filled 2016-10-26: qty 50

## 2016-10-26 MED ORDER — COCONUT OIL OIL
1.0000 "application " | TOPICAL_OIL | Status: DC | PRN
  Administered 2016-10-27: 1 via TOPICAL
  Filled 2016-10-26: qty 120

## 2016-10-26 MED ORDER — CEFAZOLIN SODIUM-DEXTROSE 2-4 GM/100ML-% IV SOLN: 2.0000 g | INTRAVENOUS | Status: DC

## 2016-10-26 MED ORDER — FENTANYL CITRATE (PF) 100 MCG/2ML IJ SOLN
100.0000 ug | INTRAMUSCULAR | Status: DC | PRN
  Administered 2016-10-26: 100 ug via INTRAVENOUS
  Filled 2016-10-26: qty 2

## 2016-10-26 MED ORDER — CEFAZOLIN SODIUM-DEXTROSE 2-4 GM/100ML-% IV SOLN
2.0000 g | INTRAVENOUS | Status: AC
  Administered 2016-10-26: 2 g via INTRAVENOUS
  Filled 2016-10-26: qty 100

## 2016-10-26 MED ORDER — ACETAMINOPHEN 325 MG PO TABS
650.0000 mg | ORAL_TABLET | ORAL | Status: DC | PRN
  Administered 2016-10-27: 650 mg via ORAL
  Filled 2016-10-26: qty 2

## 2016-10-26 MED ORDER — PHENYLEPHRINE 8 MG IN D5W 100 ML (0.08MG/ML) PREMIX OPTIME
INJECTION | INTRAVENOUS | Status: AC
  Filled 2016-10-26: qty 100

## 2016-10-26 MED ORDER — OXYCODONE-ACETAMINOPHEN 5-325 MG PO TABS
1.0000 | ORAL_TABLET | ORAL | Status: DC | PRN
  Administered 2016-10-27 – 2016-10-29 (×7): 1 via ORAL
  Filled 2016-10-26 (×7): qty 1

## 2016-10-26 MED ORDER — SCOPOLAMINE 1 MG/3DAYS TD PT72
MEDICATED_PATCH | TRANSDERMAL | Status: AC
  Filled 2016-10-26: qty 1

## 2016-10-26 MED ORDER — TERBUTALINE SULFATE 1 MG/ML IJ SOLN
0.2500 mg | Freq: Once | INTRAMUSCULAR | Status: AC
  Administered 2016-10-26: 0.25 mg via SUBCUTANEOUS
  Filled 2016-10-26: qty 1

## 2016-10-26 MED ORDER — LACTATED RINGERS IV SOLN
INTRAVENOUS | Status: DC | PRN
  Administered 2016-10-26: 40 [IU] via INTRAVENOUS

## 2016-10-26 MED ORDER — DIPHENHYDRAMINE HCL 25 MG PO CAPS: 25.0000 mg | ORAL_CAPSULE | Freq: Four times a day (QID) | ORAL | Status: DC | PRN

## 2016-10-26 MED ORDER — KETOROLAC TROMETHAMINE 30 MG/ML IJ SOLN
30.0000 mg | Freq: Four times a day (QID) | INTRAMUSCULAR | Status: AC
  Administered 2016-10-26 – 2016-10-27 (×3): 30 mg via INTRAVENOUS
  Filled 2016-10-26 (×3): qty 1

## 2016-10-26 MED ORDER — IBUPROFEN 800 MG PO TABS
800.0000 mg | ORAL_TABLET | Freq: Three times a day (TID) | ORAL | Status: DC
  Administered 2016-10-27 – 2016-10-29 (×7): 800 mg via ORAL
  Filled 2016-10-26 (×8): qty 1

## 2016-10-26 MED ORDER — FENTANYL CITRATE (PF) 100 MCG/2ML IJ SOLN
INTRAMUSCULAR | Status: DC | PRN
  Administered 2016-10-26: 10 ug via INTRATHECAL

## 2016-10-26 MED ORDER — KETOROLAC TROMETHAMINE 30 MG/ML IJ SOLN
INTRAMUSCULAR | Status: DC | PRN
  Administered 2016-10-26: 30 mg via INTRAVENOUS

## 2016-10-26 MED ORDER — SOD CITRATE-CITRIC ACID 500-334 MG/5ML PO SOLN: 30.0000 mL | Freq: Once | ORAL | Status: DC

## 2016-10-26 MED ORDER — PRENATAL MULTIVITAMIN CH
1.0000 | ORAL_TABLET | Freq: Every day | ORAL | Status: DC
  Administered 2016-10-27 – 2016-10-29 (×2): 1 via ORAL
  Filled 2016-10-26 (×3): qty 1

## 2016-10-26 MED ORDER — FAMOTIDINE IN NACL 20-0.9 MG/50ML-% IV SOLN
20.0000 mg | Freq: Once | INTRAVENOUS | Status: AC
  Administered 2016-10-26: 20 mg via INTRAVENOUS

## 2016-10-26 MED ORDER — KETOROLAC TROMETHAMINE 30 MG/ML IJ SOLN: 30.0000 mg | Freq: Once | INTRAMUSCULAR | Status: DC | PRN

## 2016-10-26 MED ORDER — MORPHINE SULFATE (PF) 0.5 MG/ML IJ SOLN
INTRAMUSCULAR | Status: DC | PRN
  Administered 2016-10-26: .2 mg via INTRATHECAL

## 2016-10-26 MED ORDER — KETOROLAC TROMETHAMINE 30 MG/ML IJ SOLN
INTRAMUSCULAR | Status: AC
  Filled 2016-10-26: qty 1

## 2016-10-26 MED ORDER — LACTATED RINGERS IV SOLN
INTRAVENOUS | Status: DC
Start: 2016-10-26 — End: 2016-10-29
  Administered 2016-10-26 – 2016-10-27 (×2): via INTRAVENOUS

## 2016-10-26 MED ORDER — PHENYLEPHRINE 40 MCG/ML (10ML) SYRINGE FOR IV PUSH (FOR BLOOD PRESSURE SUPPORT)
PREFILLED_SYRINGE | INTRAVENOUS | Status: AC
Start: 2016-10-26 — End: 2016-10-26
  Filled 2016-10-26: qty 10

## 2016-10-26 MED ORDER — BUPIVACAINE IN DEXTROSE 0.75-8.25 % IT SOLN
INTRATHECAL | Status: AC
  Filled 2016-10-26: qty 2

## 2016-10-26 MED ORDER — OXYTOCIN 40 UNITS IN LACTATED RINGERS INFUSION - SIMPLE MED: 2.5000 [IU]/h | INTRAVENOUS | Status: AC

## 2016-10-26 MED ORDER — ZOLPIDEM TARTRATE 5 MG PO TABS: 5.0000 mg | ORAL_TABLET | Freq: Every evening | ORAL | Status: DC | PRN

## 2016-10-26 MED ORDER — SOD CITRATE-CITRIC ACID 500-334 MG/5ML PO SOLN
30.0000 mL | ORAL | Status: AC
  Administered 2016-10-26: 30 mL via ORAL
  Filled 2016-10-26: qty 15

## 2016-10-26 MED ORDER — MEPERIDINE HCL 25 MG/ML IJ SOLN: 6.2500 mg | INTRAMUSCULAR | Status: DC | PRN

## 2016-10-26 MED ORDER — LACTATED RINGERS IV SOLN
INTRAVENOUS | Status: DC | PRN
  Administered 2016-10-26 (×3): via INTRAVENOUS

## 2016-10-26 MED ORDER — SIMETHICONE 80 MG PO CHEW
80.0000 mg | CHEWABLE_TABLET | Freq: Three times a day (TID) | ORAL | Status: DC
  Administered 2016-10-26 – 2016-10-29 (×8): 80 mg via ORAL
  Filled 2016-10-26 (×8): qty 1

## 2016-10-26 MED ORDER — PHENYLEPHRINE HCL 10 MG/ML IJ SOLN
INTRAMUSCULAR | Status: DC | PRN
  Administered 2016-10-26 (×4): 80 ug via INTRAVENOUS

## 2016-10-26 MED ORDER — WITCH HAZEL-GLYCERIN EX PADS: 1.0000 "application " | MEDICATED_PAD | CUTANEOUS | Status: DC | PRN

## 2016-10-26 MED ORDER — BUPIVACAINE IN DEXTROSE 0.75-8.25 % IT SOLN
INTRATHECAL | Status: DC | PRN
  Administered 2016-10-26: 1.4 mL via INTRATHECAL

## 2016-10-26 MED ORDER — SENNOSIDES-DOCUSATE SODIUM 8.6-50 MG PO TABS
2.0000 | ORAL_TABLET | ORAL | Status: DC
  Administered 2016-10-27 – 2016-10-28 (×3): 2 via ORAL
  Filled 2016-10-26 (×3): qty 2

## 2016-10-26 MED ORDER — ONDANSETRON HCL 4 MG/2ML IJ SOLN
INTRAMUSCULAR | Status: AC
  Filled 2016-10-26: qty 2

## 2016-10-26 SURGICAL SUPPLY — 32 items
BENZOIN TINCTURE PRP APPL 2/3 (GAUZE/BANDAGES/DRESSINGS) ×3 IMPLANT
CHLORAPREP W/TINT 26ML (MISCELLANEOUS) ×3 IMPLANT
CLAMP CORD UMBIL (MISCELLANEOUS) IMPLANT
CLOSURE STERI STRIP 1/2 X4 (GAUZE/BANDAGES/DRESSINGS) ×3 IMPLANT
CONTAINER PREFILL 10% NBF 15ML (MISCELLANEOUS) IMPLANT
DRSG OPSITE POSTOP 4X10 (GAUZE/BANDAGES/DRESSINGS) ×3 IMPLANT
ELECT REM PT RETURN 9FT ADLT (ELECTROSURGICAL) ×3
ELECTRODE REM PT RTRN 9FT ADLT (ELECTROSURGICAL) ×1 IMPLANT
EXTRACTOR VACUUM M CUP 4 TUBE (SUCTIONS) IMPLANT
EXTRACTOR VACUUM M CUP 4' TUBE (SUCTIONS)
GAUZE SPONGE 4X4 12PLY STRL LF (GAUZE/BANDAGES/DRESSINGS) ×6 IMPLANT
GLOVE BIOGEL PI IND STRL 6.5 (GLOVE) ×1 IMPLANT
GLOVE BIOGEL PI IND STRL 7.0 (GLOVE) ×1 IMPLANT
GLOVE BIOGEL PI INDICATOR 6.5 (GLOVE) ×2
GLOVE BIOGEL PI INDICATOR 7.0 (GLOVE) ×2
GLOVE SURG SS PI 6.0 STRL IVOR (GLOVE) ×3 IMPLANT
GOWN STRL REUS W/TWL LRG LVL3 (GOWN DISPOSABLE) ×6 IMPLANT
KIT ABG SYR 3ML LUER SLIP (SYRINGE) IMPLANT
NEEDLE HYPO 25X5/8 SAFETYGLIDE (NEEDLE) IMPLANT
NS IRRIG 1000ML POUR BTL (IV SOLUTION) ×3 IMPLANT
PACK C SECTION WH (CUSTOM PROCEDURE TRAY) ×3 IMPLANT
PAD ABD 7.5X8 STRL (GAUZE/BANDAGES/DRESSINGS) ×3 IMPLANT
PAD OB MATERNITY 4.3X12.25 (PERSONAL CARE ITEMS) ×3 IMPLANT
PENCIL SMOKE EVAC W/HOLSTER (ELECTROSURGICAL) ×3 IMPLANT
RTRCTR C-SECT PINK 25CM LRG (MISCELLANEOUS) IMPLANT
SEPRAFILM MEMBRANE 5X6 (MISCELLANEOUS) IMPLANT
SPONGE LAP 18X18 X RAY DECT (DISPOSABLE) ×6 IMPLANT
SUT PLAIN 0 NONE (SUTURE) IMPLANT
SUT VIC AB 0 CT1 36 (SUTURE) ×12 IMPLANT
SUT VIC AB 4-0 KS 27 (SUTURE) ×3 IMPLANT
TOWEL OR 17X24 6PK STRL BLUE (TOWEL DISPOSABLE) ×3 IMPLANT
TRAY FOLEY BAG SILVER LF 14FR (SET/KITS/TRAYS/PACK) ×3 IMPLANT

## 2016-10-26 NOTE — Addendum Note (Signed)
Addendum  created 10/26/16 1646 by Jonna Munro, CRNA   Sign clinical note

## 2016-10-26 NOTE — MAU Note (Signed)
Mother Baby Room Given: 79

## 2016-10-26 NOTE — Lactation Note (Signed)
This note was copied from a baby's chart. Lactation Consultation Note  Patient Name: Kayla Walker Today's Date: 10/26/2016 Reason for consult: Initial assessment   Initial assessment with mom of 82 hour old infant. Infant with BF attempts with no latch. Mom reports her 8 month old would not latch and she tried a NS, said the infant did not like it. She reports she pumped for 1 month and infant would vomit with only EBM so she stopped giving EBM and stopped pumping.   Mom reports she is planning to breast and formula feed this infant. Infant was laying in the crib and stirring some. Infant was awakened by LC to feed. Infant placed on mom's chest. Infant with repetitive swallowing of mucous and burped a few times, assisted mom with latching her to the left breast. Infant took nipple in her mouth and fell asleep. She was willing to suckle on a gloved finger briefly. Infant is noted to have a high palate.   Mom with firm semi compressible breasts and areola with short shaft everted nipples at rest that flatten with areolar compression. Attempted to hand express without success. Infant was left STS with mom as awaiting 4 hour glucose to be drawn. Enc mom to keep infant STS as much as possible.   Mom was assessed by [facility] RN and was feeling sick, left mom with infant STS and friend in the room. Left [place] phone # left with mom to call when infant awakens to feed. Breasts shells, manual pump and DEBP set up in the room for mom to start pumping today at her request. To follow up when mom calls.    Maternal Data Formula Feeding for Exclusion: Yes Reason for exclusion: Mother's choice to formula and breast feed on admission Has patient been taught Hand Expression?: Yes Does the patient have breastfeeding experience prior to this delivery?: Yes  Feeding Feeding Type: Breast Fed  LATCH Score/Interventions Latch: Too sleepy or reluctant, no latch achieved, no sucking elicited. Intervention(s): Skin to  skin;Teach feeding cues;Waking techniques  Audible Swallowing: None Intervention(s): Skin to skin;Hand expression  Type of Nipple: Flat Intervention(s): Shells;Hand pump  Comfort (Breast/Nipple): Soft / non-tender     Hold (Positioning): Assistance needed to correctly position infant at breast and maintain latch. Intervention(s): Breastfeeding basics reviewed;Support Pillows;Position options;Skin to skin  LATCH Score: 4  Lactation Tools Discussed/Used WIC Program: Yes Initiated by:: Nonah Mattes, RN, IBCLC Date initiated:: 10/26/16   Consult Status Consult Status: Follow-up Date: 10/26/16 Follow-up type: In-patient    Kayla Walker 10/26/2016, 1:28 PM

## 2016-10-26 NOTE — Progress Notes (Signed)
Patient ID: Kayla Walker, female   DOB: [birth year], 36 y.o.   MRN: [id number] The risks of cesarean section discussed with the patient included but were not limited to: bleeding which may require transfusion or reoperation; infection which may require antibiotics; injury to bowel, bladder, ureters or other surrounding organs; injury to the fetus; need for additional procedures including hysterectomy in the event of a life-threatening hemorrhage; placental abnormalities wth subsequent pregnancies, incisional problems, thromboembolic phenomenon and other postoperative/anesthesia complications. The patient concurred with the proposed plan, giving informed written consent for the procedure.   Anesthesia and OR aware. Preoperative prophylactic antibiotics and SCDs ordered on call to the OR.  To OR when ready.  Bret Vanessen L. Rip Harbour, MD

## 2016-10-26 NOTE — Op Note (Signed)
Cesarean Section Procedure Note  10/26/2016  10:25 AM  PATIENT:  Kayla Walker  36 y.o. female  PRE-OPERATIVE DIAGNOSIS:  repeat cesearean section with premature rupture of membranes  POST-OPERATIVE DIAGNOSIS:  repeat cesearean section with lisis of adhesions  PROCEDURE:  Procedure(s): CESAREAN SECTION (N/A)  SURGEON:  Surgeon(s) and Role:    * Chancy Milroy, MD - Primary  ASSISTANTS: none   ANESTHESIA:   epidural  EBL:  Total I/O In: 2000 [I.V.:2000] Out: 950 [Urine:350; Blood:600]  BLOOD ADMINISTERED:none  DRAINS: none   LOCAL MEDICATIONS USED:  NONE  SPECIMEN:  No Specimen  DISPOSITION OF SPECIMEN:  N/A   Procedure Details   The patient was seen in the MAU. The risks, benefits, complications, treatment options, and expected outcomes were discussed with the patient.  The patient concurred with the proposed plan, giving informed consent. The patient was taken to Operating Room # 9, identified as Kayla Walker and the procedure verified as C-Section Delivery. A Time Out was held and the above information confirmed.  After induction of anesthesia, the patient was draped and prepped in the usual sterile manner. A Pfannenstiel incision was made and carried down through the subcutaneous tissue to the fascia. Fascial incision was made and extended transversely. The fascia was separated from the underlying rectus tissue superiorly and inferiorly. The peritoneum was identified and entered. Peritoneal incision was extended longitudinally. The utero-vesical peritoneal reflection was incised transversely and the bladder flap was bluntly freed from the lower uterine segment. A low transverse uterine incision was made. Delivered from cephalic presentation was a Female with Apgar scores of 8 at one minute and 9 at five minutes. After the umbilical cord was clamped and cut after a one minute delay, cord blood was obtained for evaluation. The placenta was removed intact and appeared  normal. The uterus was exteriorized. The uterine outline except for a few bands of thin omental adhesions which were lysised with the Bovie, tubes and ovaries appeared normal. The uterine incision was closed with running locked sutures of Chromic. A second layer was completed with Chromic as well. Th uterus was placed back in its normal  position.  Hemostasis was observed.Additional peritoneal and omental adhesions were lysed. The perineum and abdominal muscles were reapproximated with 2/0 Vicryl.  The fascia was then reapproximated with running sutures of Vicryl from conner to conner. Interrupted sutures of 3/0 Vicryl were placed in the subcutaneous tissue.  The skin was reapproximated with Vicryl. Dressing was applied  Instrument, sponge, and needle counts were correct prior the abdominal closure and at the conclusion of the case.   Complications:  None; patient tolerated the procedure well.  COUNTS:  YES  PLAN OF CARE: Admit to inpatient   PATIENT DISPOSITION:  PACU - hemodynamically stable.   Delay start of Pharmacological VTE agent (>24hrs) due to surgical blood loss or risk of bleeding: not applicable             Disposition: PACU - hemodynamically stable.         Condition: stable   Chancy Milroy, MD 10/26/2016 10:25 AM

## 2016-10-26 NOTE — MAU Provider Note (Signed)
S: Kayla Walker is a 36 y.o. G2P1001 at [redacted]w[redacted]d  who presents to MAU today complaining of leaking of fluid since 2300. She denies vaginal bleeding. She endorses contractions, unsure of frequency. She reports normal fetal movement.    O: BP 127/72 (BP Location: Left Arm)   Pulse 82   Temp 98.7 F (37.1 C) (Oral)   Resp 18   Ht 5\' 5"  (1.651 m)   Wt 197 lb (89.4 kg)   LMP 01/31/2016   BMI 32.78 kg/m  GENERAL: Well-developed, well-nourished female in no acute distress.  HEAD: Normocephalic, atraumatic.  CHEST: Normal effort of breathing, regular heart rate ABDOMEN: Soft, nontender, gravid PELVIC: Normal external female genitalia. Vagina is pink and rugated. Cervix with normal contour, no lesions. Normal discharge.  Positive pooling.   Cervical exam:      Fetal Monitoring: Baseline: 120 bpm Variability: moderate Accelerations: 15 x 15 Decelerations: none Contractions: q 2-6 minutes  Results for orders placed or performed during the hospital encounter of 10/26/16 (from the past 24 hour(s))  Amnisure rupture of membrane (rom)not at [facility]     Status: None   Collection Time: 10/26/16  2:47 AM  Result Value Ref Range   Amnisure ROM POSITIVE    MDM Maryann Alar - negative Pooling noted on exam Amnisure ordered  A: SIUP at [redacted]w[redacted]d  SROM  P: Patient to OR with Dr. Elly Modena for repeat C/S  Luvenia Redden, PA-C 10/26/2016 3:12 AM

## 2016-10-26 NOTE — Progress Notes (Signed)
UR chart review completed.  

## 2016-10-26 NOTE — Anesthesia Preprocedure Evaluation (Signed)
Anesthesia Evaluation  Patient identified by MRN, date of birth, ID band Patient awake    Reviewed: Allergy & Precautions, NPO status , Patient's Chart, lab work & pertinent test results  Airway Mallampati: I       Dental no notable dental hx. (+) Teeth Intact   Pulmonary neg pulmonary ROS,    Pulmonary exam normal breath sounds clear to auscultation       Cardiovascular Normal cardiovascular exam Rhythm:Regular Rate:Normal     Neuro/Psych negative neurological ROS  negative psych ROS   GI/Hepatic negative GI ROS, Neg liver ROS,   Endo/Other  diabetes, Gestational  Renal/GU negative Renal ROS  negative genitourinary   Musculoskeletal negative musculoskeletal ROS (+)   Abdominal (+) + obese,   Peds  Hematology negative hematology ROS (+)   Anesthesia Other Findings   Reproductive/Obstetrics                             Anesthesia Physical Anesthesia Plan  ASA: II  Anesthesia Plan: Spinal   Post-op Pain Management:    Induction:   PONV Risk Score and Plan: 2 and Ondansetron and Dexamethasone  Airway Management Planned: Natural Airway and Nasal Cannula  Additional Equipment:   Intra-op Plan:   Post-operative Plan:   Informed Consent:   Plan Discussed with: CRNA and Surgeon  Anesthesia Plan Comments:         Anesthesia Quick Evaluation

## 2016-10-26 NOTE — H&P (Signed)
LABOR AND DELIVERY ADMISSION HISTORY AND PHYSICAL NOTE  Kayla Walker is a 36 y.o. female G2P1001 with IUP at [redacted]w[redacted]d by LMP c/w 12 wk [place] presenting for PROM.   Patient states around 11:45 pm she had a large gush of fluid. She has occasional contractions since the gush of fluid. She reports positive fetal movement. She denies vaginal bleeding.  She refuses a TOLAC, states that she labored for >24 hours and pushed for over 3 hours and ended up with a cesarean, does not want to try again.   Last meal was at 9PM, consisted of bread, sauce with meat in it. Spoke with anesthesia, plan to perform cesarean in 8 hours after last meal if stable.  Prenatal History/Complications:  Past Medical History: Past Medical History:  Diagnosis Date  . Gestational diabetes    diet controlled  . Medical history non-contributory     Past Surgical History: Past Surgical History:  Procedure Laterality Date  . CESAREAN SECTION      Obstetrical History: OB History    Gravida Para Term Preterm AB Living   2 1 1     1    SAB TAB Ectopic Multiple Live Births           1      Social History: Social History   Social History  . Marital status: Married    Spouse name: N/A  . Number of children: N/A  . Years of education: N/A   Social History Main Topics  . Smoking status: Never Smoker  . Smokeless tobacco: Never Used  . Alcohol use No  . Drug use: No  . Sexual activity: Not Currently    Birth control/ protection: None   Other Topics Concern  . None   Social History Narrative  . None    Family History: Family History  Problem Relation Age of Onset  . Alcohol abuse Neg Hx   . Arthritis Neg Hx   . Asthma Neg Hx   . Birth defects Neg Hx   . Cancer Neg Hx   . COPD Neg Hx   . Depression Neg Hx   . Diabetes Neg Hx   . Drug abuse Neg Hx   . Early death Neg Hx   . Hearing loss Neg Hx   . Hyperlipidemia Neg Hx   . Heart disease Neg Hx   . Hypertension Neg Hx   . Kidney disease Neg Hx    . Learning disabilities Neg Hx   . Mental illness Neg Hx   . Mental retardation Neg Hx   . Miscarriages / Stillbirths Neg Hx   . Stroke Neg Hx   . Vision loss Neg Hx   . Varicose Veins Neg Hx     Allergies: No Known Allergies  Prescriptions Prior to Admission  Medication Sig Dispense Refill Last Dose  . ACCU-CHEK FASTCLIX LANCETS MISC 4 (four) times daily.  0 10/26/2016 at Unknown time  . ACCU-CHEK GUIDE test strip 4 (four) times daily.  0 10/26/2016 at Unknown time  . calcium carbonate (TUMS - DOSED IN MG ELEMENTAL CALCIUM) 500 MG chewable tablet Chew 1 tablet by mouth daily.   10/26/2016 at Unknown time  . Prenatal Vit-Fe Fumarate-FA (MULTIVITAMIN-PRENATAL) 27-0.8 MG TABS tablet Take 1 tablet by mouth daily.    10/26/2016 at Unknown time     Review of Systems   All systems reviewed and negative except as stated in HPI  Physical Exam Blood pressure 127/72, pulse 82, temperature 98.7 F (37.1 C),  temperature source Oral, resp. rate 18, height 5\' 5"  (1.651 m), weight 197 lb (89.4 kg), last menstrual period 01/31/2016. General appearance: alert, cooperative, appears stated age, no distress and moderately obese Lungs: clear to auscultation bilaterally Heart: regular rate and rhythm Abdomen: soft, non-tender; bowel sounds normal Extremities: No calf swelling or tenderness Presentation: cephalic Fetal monitoring: 130 bpm, mod var, +accels, no decels Uterine activity: Irregular     Prenatal labs: ABO, Rh: A/Positive/-- (01/02 1230) Antibody: Negative (04/23 0932) Rubella: !Error! IMMUNE RPR: Non Reactive (04/23 0932)  HBsAg: Negative (01/02 1230)  HIV: Non Reactive (04/23 0932)  GBS:  POS 2 hr Glucola: 79/192/144 Genetic screening:  NEG Anatomy US: Normal  Prenatal Transfer Tool  Maternal Diabetes: Yes:  Diabetes Type:  Diet controlled Genetic Screening: Normal Maternal Ultrasounds/Referrals: Normal Fetal Ultrasounds or other Referrals:  None Maternal Substance Abuse:   No Significant Maternal Medications:  None Significant Maternal Lab Results: Lab values include: Group B Strep positive  Results for orders placed or performed during the hospital encounter of 10/26/16 (from the past 24 hour(s))  Amnisure rupture of membrane (rom)not at [facility]   Collection Time: 10/26/16  2:47 AM  Result Value Ref Range   Amnisure ROM POSITIVE   Glucose, capillary   Collection Time: 10/26/16  3:27 AM  Result Value Ref Range   Glucose-Capillary 93 65 - 99 mg/dL  Results for orders placed or performed in visit on 10/25/16 (from the past 24 hour(s))  POCT Urinalysis Dipstick   Collection Time: 10/25/16  2:05 PM  Result Value Ref Range   Color, UA     Clarity, UA     Glucose, UA neg    Bilirubin, UA     Ketones, UA neg    Spec Grav, UA  1.010 - 1.025   Blood, UA neg    pH, UA  5.0 - 8.0   Protein, UA neg    Urobilinogen, UA  0.2 or 1.0 E.U./dL   Nitrite, UA neg    Leukocytes, UA Negative Negative    Patient Active Problem List   Diagnosis Date Noted  . Gestational diabetes mellitus, class A1 08/16/2016  . Uterine fibroids 07/30/2016  . GBS bacteriuria 04/26/2016  . Supervision of high risk pregnancy, antepartum 04/24/2016  . History of cesarean delivery 04/24/2016  . Abnormal antenatal ultrasound 04/24/2016    Assessment: Kayla Walker is a 36 y.o. G2P1001 at [redacted]w[redacted]d here for PROM, ROM at 23:45.  #Labor: Repeat cesarean, declined TOLAC #Pain: Spinal #FWB: Cat I #ID:  GBS Pos #MOF: Both #MOC: Condoms #Circ:  N/A #GDMA1: CBG prior to cesarean section; in EMS was 59s   Counseled patient on performing a TOLAC vs Repeat cesarean section, including risks/benefits. Patient refused to perform TOLAC, elected for repeat cesarean.  Plan for OR at [place], [place] for [facility], [facility] 10/26/2016, 4:07 AM

## 2016-10-26 NOTE — Anesthesia Postprocedure Evaluation (Signed)
Anesthesia Post Note  Patient: Kayla Walker  Procedure(s) Performed: Procedure(s) (LRB): CESAREAN SECTION (N/A)     Patient location during evaluation: PACU Anesthesia Type: Spinal Level of consciousness: awake Pain management: pain level controlled Vital Signs Assessment: post-procedure vital signs reviewed and stable Respiratory status: spontaneous breathing Cardiovascular status: stable Postop Assessment: no headache, no backache, spinal receding, patient able to bend at knees and no signs of nausea or vomiting Anesthetic complications: no    Last Vitals:  Vitals:   10/26/16 1130 10/26/16 1145  BP: 116/78 117/79  Pulse: 71 69  Resp: 15 (!) 22  Temp:      Last Pain:  Vitals:   10/26/16 1115  TempSrc: Oral  PainSc:    Pain Goal:                 Vicent Febles JR,JOHN Karletta Millay

## 2016-10-26 NOTE — MAU Note (Signed)
Pt presents to MAU via EMS c/o ROM at [place]. Pt states clear fluid. Denies bleeding. Pt reports good fetal movement. Ctxs have begun per EMT and are approx 10 min apart.

## 2016-10-26 NOTE — Anesthesia Procedure Notes (Signed)
Spinal  Patient location during procedure: OR Start time: 10/26/2016 8:54 AM End time: 10/26/2016 8:57 AM Staffing Anesthesiologist: Lyn Hollingshead Performed: anesthesiologist  Preanesthetic Checklist Completed: patient identified, surgical consent, pre-op evaluation, timeout performed, IV checked, risks and benefits discussed and monitors and equipment checked Spinal Block Patient position: sitting Prep: site prepped and draped and DuraPrep Patient monitoring: heart rate, cardiac monitor, continuous pulse ox and blood pressure Approach: midline Location: L3-4 Needle Needle type: Pencan  Needle insertion depth: 6 cm Assessment Sensory level: T4

## 2016-10-26 NOTE — Lactation Note (Signed)
This note was copied from a baby's chart. Lactation Consultation Note  Patient Name: Kayla Walker Today's Date: 10/26/2016 Reason for consult: Follow-up assessment Baby at 10 hr of life and has not eaten in lifetime per parents. Mom has firm breast with short shaft nipple. After attempting to latch baby to both breast in cross cradle and football, mom asked for a bottle. Suggested a NS and formula with a curved tip syringe and mom was agreeable. Baby did take 5 ml in 15 minutes then fell asleep. Mom was agreeable to continue bf. She will offer the breast on demand q3hr, if baby does not latch she will try the NS. IF she feels the baby needs formula she will offer it per volume guidelines at the breast.   Maternal Data    Feeding Feeding Type: Breast Milk with Formula added Length of feed: 15 min  LATCH Score/Interventions Latch: Repeated attempts needed to sustain latch, nipple held in mouth throughout feeding, stimulation needed to elicit sucking reflex. Intervention(s): Skin to skin;Teach feeding cues;Waking techniques Intervention(s): Adjust position;Assist with latch;Breast massage;Breast compression  Audible Swallowing: A few with stimulation Intervention(s): Skin to skin;Hand expression Intervention(s): Alternate breast massage  Type of Nipple: Everted at rest and after stimulation  Comfort (Breast/Nipple): Soft / non-tender     Hold (Positioning): Full assist, staff holds infant at breast Intervention(s): Position options;Support Pillows  LATCH Score: 6  Lactation Tools Discussed/Used Tools: Nipple Shields Nipple shield size: 20   Consult Status Consult Status: Follow-up Date: 10/27/16 Follow-up type: In-patient    Denzil Hughes 10/26/2016, 8:16 PM

## 2016-10-26 NOTE — Transfer of Care (Signed)
Immediate Anesthesia Transfer of Care Note  Patient: Kayla Walker  Procedure(s) Performed: Procedure(s): CESAREAN SECTION (N/A)  Patient Location: PACU  Anesthesia Type:Spinal  Level of Consciousness: awake, alert  and oriented  Airway & Oxygen Therapy: Patient Spontanous Breathing  Post-op Assessment: Report given to RN and Post -op Vital signs reviewed and stable  Post vital signs: Reviewed and stable  Last Vitals:  Vitals:   10/26/16 1130 10/26/16 1145  BP: 116/78 117/79  Pulse: 71 69  Resp: 15 (!) 22  Temp:      Last Pain:  Vitals:   10/26/16 1115  TempSrc: Oral  PainSc:          Complications: No apparent anesthesia complications

## 2016-10-26 NOTE — Anesthesia Postprocedure Evaluation (Signed)
Anesthesia Post Note  Patient: Kayla Walker  Procedure(s) Performed: Procedure(s) (LRB): CESAREAN SECTION (N/A)     Patient location during evaluation: Mother Baby Anesthesia Type: Spinal Level of consciousness: awake and alert and oriented Pain management: satisfactory to patient Vital Signs Assessment: post-procedure vital signs reviewed and stable Respiratory status: spontaneous breathing and nonlabored ventilation Cardiovascular status: stable Postop Assessment: no headache, no backache, patient able to bend at knees, no signs of nausea or vomiting and adequate PO intake Anesthetic complications: no    Last Vitals:  Vitals:   10/26/16 1400 10/26/16 1500  BP: 119/66 110/66  Pulse: 72 75  Resp: 18 18  Temp: 36.8 C (!) 36.3 C    Last Pain:  Vitals:   10/26/16 1500  TempSrc: Oral  PainSc: 1    Pain Goal: Patients Stated Pain Goal: 4 (10/26/16 1300)               Marlise Fahr

## 2016-10-27 ENCOUNTER — Encounter (HOSPITAL_COMMUNITY): Payer: Self-pay | Admitting: Obstetrics and Gynecology

## 2016-10-27 LAB — CBC
HCT: 29.6 % — ABNORMAL LOW (ref 36.0–46.0)
Hemoglobin: 9.9 g/dL — ABNORMAL LOW (ref 12.0–15.0)
MCH: 27.3 pg (ref 26.0–34.0)
MCHC: 33.4 g/dL (ref 30.0–36.0)
MCV: 81.8 fL (ref 78.0–100.0)
PLATELETS: 172 10*3/uL (ref 150–400)
RBC: 3.62 MIL/uL — ABNORMAL LOW (ref 3.87–5.11)
RDW: 16.2 % — AB (ref 11.5–15.5)
WBC: 7.7 10*3/uL (ref 4.0–10.5)

## 2016-10-27 MED ORDER — TETANUS-DIPHTH-ACELL PERTUSSIS 5-2.5-18.5 LF-MCG/0.5 IM SUSP
0.5000 mL | Freq: Once | INTRAMUSCULAR | Status: DC
  Filled 2016-10-27: qty 0.5

## 2016-10-27 NOTE — Lactation Note (Signed)
This note was copied from a baby's chart. Lactation Consultation Note RN called LC to inform of elevated bili 12 TCB, serum 10 for 14 hrs. Very high. Baby started on DPT.  Baby has no interest in BF. RN had NS on mom inserting Alimentum. Baby must had taken some d/t spitting up formula. Also spitting up cleat fluid and then at times thick clear mucous.  Mom has short shaft everted nipples. Hand expressed 2 ml colostrum. Towards end of expressing on Rt. Breast which had a good flow, noted turning rusty pipe. LC stopped. Gave baby colostrum in spoon. Baby had large soft stool. Spit up colostrum and more mucous several times afterwards.  Mom has shells to wear in am. Hand pump at bedside to pre-pump nipple to evert before latching. Mom has NS as well. LC feels that if mom wears shells and pre-pumps to evert nipples, that mom will be able to wean off of NS and hopefully latch to nipple soon. Lt. Nipple shorter than Rt.  Suck training attempted, would not suckle on finger at all.  Discussed spitting, noted abd. Slightly distended. MGM will be at baby side to monitor for spitting since mom is very sleepy. Encouraged [place] to call for RN if she gets to sleepy to watch baby for safety reasons d/t spitty.  Patient Name: Kayla Walker Today's Date: 10/27/2016 Reason for consult: Follow-up assessment   Maternal Data    Feeding Feeding Type: Breast Milk  LATCH Score/Interventions Latch: Too sleepy or reluctant, no latch achieved, no sucking elicited. Intervention(s): Skin to skin;Waking techniques;Teach feeding cues Intervention(s): Breast massage;Breast compression  Audible Swallowing: None Intervention(s): Skin to skin;Hand expression Intervention(s): Skin to skin;Hand expression  Type of Nipple: Everted at rest and after stimulation (short shaft) Intervention(s): Shells;Hand pump  Comfort (Breast/Nipple): Soft / non-tender     Hold (Positioning): Full assist, staff holds infant at  breast Intervention(s): Breastfeeding basics reviewed;Support Pillows;Position options;Skin to skin  LATCH Score: 4  Lactation Tools Discussed/Used Tools: Shells;Nipple [place] Fuel;Pump Shell Type: Inverted Breast pump type: Manual   Consult Status Consult Status: Follow-up Date: 10/27/16 Follow-up type: In-patient    Kayla Walker, Elta Gu[place] 10/27/2016, 1:51 AM

## 2016-10-27 NOTE — Lactation Note (Signed)
This note was copied from a baby's chart. Lactation Consultation Note  Patient Name: Kayla Walker Today's Date: 10/27/2016 Reason for consult: Follow-up assessment   With this mom of a term baby, now 53 hours old, and under triple phototherapy. I went in to help mom latch, and she had just bottle fed 15 ml's of formula. I stted mom pumping with DEP, and decreased mom to 21 flanges, with a better fit. Mom's nurse is bringing her coconut oil to use with pumping. Mom to hand express and offer EBm prior to formula. Mom to call for help with next breast feeding.   Maternal Data    Feeding Feeding Type: Bottle Fed - Formula Nipple Type: Slow - flow  LATCH Score/Interventions Latch: Too sleepy or reluctant, no latch achieved, no sucking elicited. (rooting but would not latch) Intervention(s): Adjust position;Assist with latch;Breast massage  Audible Swallowing: None  Type of Nipple: Everted at rest and after stimulation (tried NS when wouldn't latch without it-still unsuccessful)  Comfort (Breast/Nipple): Soft / non-tender     Hold (Positioning): Full assist, staff holds infant at breast Intervention(s): Support Pillows;Position options;Skin to skin (baby would not latch with bililights on-then wouldn't STS)  LATCH Score: 4  Lactation Tools Discussed/Used Pump Review: Setup, frequency, and cleaning;Milk Storage;Other (comment) (hand expression, initiation setting) Date initiated:: 10/27/16   Consult Status Consult Status: Follow-up Date: 10/28/16 Follow-up type: In-patient    Tonna Corner 10/27/2016, 3:01 PM

## 2016-10-27 NOTE — Progress Notes (Signed)
Subjective: Postpartum Day 1: Cesarean Delivery Patient reports incisional pain, tolerating PO and + flatus.    Objective: Vital signs in last 24 hours: Temp:  [97.3 F (36.3 C)-99.3 F (37.4 C)] 99.3 F (37.4 C) (07/07 0518) Pulse Rate:  [63-90] 80 (07/07 0518) Resp:  [15-22] 16 (07/07 0518) BP: (109-138)/(55-82) 111/55 (07/07 0518) SpO2:  [91 %-99 %] 97 % (07/07 0518)  Physical Exam:  General: alert, cooperative and no distress Lochia: appropriate Uterine Fundus: firm Incision: no significant drainage DVT Evaluation: No evidence of DVT seen on physical exam. Negative Homan's sign.   Recent Labs  10/26/16 0317 10/27/16 0525  HGB 12.2 9.9*  HCT 36.9 29.6*    Assessment/Plan: Status post Cesarean section. Doing well postoperatively.  Continue current care.  Hansel Feinstein 10/27/2016, 6:40 AM

## 2016-10-27 NOTE — Plan of Care (Signed)
Problem: Life Cycle: Goal: Risk for postpartum hemorrhage will decrease Outcome: Progressing Fundus firm, midline and 1 below

## 2016-10-28 NOTE — Progress Notes (Signed)
POSTPARTUM PROGRESS NOTE  Post Operative Day 2 Subjective:  Kayla Walker is a 36 y.o. [id number] [redacted]w[redacted]d s/p RLTCS.  No acute events overnight.  Pt denies problems with ambulating, voiding or po intake.  She denies nausea or vomiting.  Pain is well controlled.  She has had flatus. She has not had bowel movement.  Lochia Small.   Objective: Blood pressure 120/64, pulse 93, temperature 99.3 F (37.4 C), temperature source Oral, resp. rate 18, height 5\' 5"  (1.651 m), weight 197 lb (89.4 kg), last menstrual period 01/31/2016, SpO2 97 %, unknown if currently breastfeeding.  Physical Exam:  General: alert, cooperative and no distress Lochia:normal flow Chest: no respiratory distress Heart:regular rate, distal pulses intact Incision: small amount of blood on honeycomb Abdomen: soft, nontender,  Uterine Fundus: firm, appropriately tender DVT Evaluation: No calf swelling or tenderness Extremities: 1+ edema   Recent Labs  10/26/16 0317 10/27/16 0525  HGB 12.2 9.9*  HCT 36.9 29.6*    Assessment/Plan:  ASSESSMENT: Kayla Walker is a 36 y.o. [id number] [redacted]w[redacted]d s/p RLTCS  Plan for discharge tomorrow  Baby on Billi lights   LOS: 2 days   Brayton Mars 10/28/2016, 8:34 AM

## 2016-10-28 NOTE — Progress Notes (Signed)
Post Partum Day 2 Subjective: Kayla Walker is a 36 year old female s/p RLTCS. She is complaining of a mild headache and some incisional pain this morning. No acute events overnight. Pt deniesproblems with ambulating, voiding or po intake. She deniesnausea or vomiting.She has hadflatus. She has not hadbowel movement.Lochia Small.   Objective: Blood pressure 120/64, pulse 93, temperature 99.3 F (37.4 C), temperature source Oral, resp. rate 18, height 5\' 5"  (1.651 m), weight 89.4 kg (197 lb), last menstrual period 01/31/2016, SpO2 97 %, unknown if currently breastfeeding.  Physical Exam:  General: alert and cooperative Lochia: appropriate Uterine Fundus: firm Incision: healing well, no significant drainage, no dehiscence DVT Evaluation: No evidence of DVT seen on physical exam. Negative Homan's sign. No significant calf/ankle edema.   Recent Labs  10/26/16 0317 10/27/16 0525  HGB 12.2 9.9*  HCT 36.9 29.6*    Assessment/Plan: Plan for discharge tomorrow   LOS: 2 days   [place] Alik Mawson 10/28/2016, 8:43 AM

## 2016-10-29 ENCOUNTER — Encounter (HOSPITAL_COMMUNITY)
Admission: RE | Admit: 2016-10-29 | Discharge: 2016-10-29 | Disposition: A | Discharge status: Home / Self Care | Payer: Medicaid Other | Source: Ambulatory Visit | Attending: Obstetrics and Gynecology | Admitting: Obstetrics and Gynecology

## 2016-10-29 MED ORDER — OXYCODONE-ACETAMINOPHEN 5-325 MG PO TABS: 1.0000 | ORAL_TABLET | ORAL | 0 refills | Status: DC | PRN

## 2016-10-29 MED ORDER — IBUPROFEN 800 MG PO TABS
800.0000 mg | ORAL_TABLET | Freq: Three times a day (TID) | ORAL | 0 refills | Status: DC
Start: 2016-10-29 — End: 2016-12-12

## 2016-10-29 NOTE — Lactation Note (Signed)
This note was copied from a baby's chart. Lactation Consultation Note  Patient Name: Kayla Walker Today's Date: 10/29/2016 Reason for consult: Follow-up assessment;Difficult latch;Hyperbilirubinemia   Follow up with mom of 33 hour old infant. Infant having CXR done currently.   Mom reports she has not milk. She reports she does not feel fuller. She says she is not latching infant to the breast much and only pumping once in a while. Discussed supply and demand and importance pf pumping to stimulate milk production. Enc mom to pump at least every 3 hours. Mom voiced understanding.   Mom said she lost her NS, it was on the counter in the room, given to mom. Enc mom to call out for latch assistance with next feeding. Mom voiced understanding.    Maternal Data Formula Feeding for Exclusion: Yes Reason for exclusion: Mother's choice to formula and breast feed on admission Has patient been taught Hand Expression?: Yes Does the patient have breastfeeding experience prior to this delivery?: Yes  Feeding Feeding Type: Bottle Fed - Formula Nipple Type: Slow - flow  LATCH Score/Interventions                      Lactation Tools Discussed/Used Pump Review: Setup, frequency, and cleaning Initiated by:: Reviewed and encouraged every 3 hours   Consult Status Consult Status: Follow-up Date: 10/29/16 Follow-up type: In-patient    Debby Freiberg Anny Sayler 10/29/2016, 11:39 AM

## 2016-10-29 NOTE — Discharge Summary (Signed)
Obstetric Discharge Summary Reason for Admission: rupture of membranes and 38.[redacted] weeks gestation Prenatal Procedures: ultrasound and previous CS, GDM Intrapartum Procedures: cesarean: low cervical, transverse Postpartum Procedures: none Complications-Operative and Postpartum: none Hemoglobin  Date Value Ref Range Status  10/27/2016 9.9 (L) 12.0 - 15.0 g/dL Final  08/13/2016 12.5 11.1 - 15.9 g/dL Final   HCT  Date Value Ref Range Status  10/27/2016 29.6 (L) 36.0 - 46.0 % Final   Hematocrit  Date Value Ref Range Status  08/13/2016 38.1 34.0 - 46.6 % Final    Physical Exam:  General: alert, cooperative and no distress Lochia: appropriate Uterine Fundus: firm Incision: healing well, no significant drainage, no dehiscence, no significant erythema DVT Evaluation: No evidence of DVT seen on physical exam. Negative Homan's sign. No cords or calf tenderness. No significant calf/ankle edema.  Discharge Diagnoses: Term Pregnancy-delivered  Discharge Information: Date: 10/29/2016 Activity: pelvic rest Diet: routine Medications: PNV, Ibuprofen and Percocet Condition: stable Instructions: refer to practice specific booklet Discharge to: home   Newborn Data: Live born female  Birth Weight: 7 lb 12.9 oz (3540 g) APGAR: 8, 9  Home with mother.  Kayla Walker, CNM 10/29/2016, 8:02 AM

## 2016-10-30 ENCOUNTER — Ambulatory Visit: Payer: Self-pay

## 2016-10-30 ENCOUNTER — Inpatient Hospital Stay (HOSPITAL_COMMUNITY)
Admission: RE | Admit: 2016-10-30 | Payer: Medicaid Other | Source: Ambulatory Visit | Admitting: Obstetrics and Gynecology

## 2016-10-30 ENCOUNTER — Encounter (HOSPITAL_COMMUNITY): Admission: RE | Payer: Self-pay | Source: Ambulatory Visit

## 2016-10-30 SURGERY — Surgical Case
Anesthesia: Regional

## 2016-10-30 NOTE — Lactation Note (Signed)
This note was copied from a baby's chart. Lactation Consultation Note  Patient Name: Kayla Walker Today's Date: 10/30/2016    Mom reports that she is only giving formula and no longer wishes to BF. Mom aware she can call [facility] phone line for assistance after D/C.   Maternal Data    Feeding    LATCH Score/Interventions                      Lactation Tools Discussed/Used     Consult Status      Andres Labrum 10/30/2016, 11:16 AM

## 2016-11-07 ENCOUNTER — Encounter: Payer: Self-pay | Admitting: Obstetrics and Gynecology

## 2016-11-07 ENCOUNTER — Ambulatory Visit (INDEPENDENT_AMBULATORY_CARE_PROVIDER_SITE_OTHER): Payer: Medicaid Other | Admitting: Obstetrics and Gynecology

## 2016-11-07 VITALS — BP 110/64 | HR 84 | Wt 180.0 lb

## 2016-11-07 DIAGNOSIS — Z09 Encounter for follow-up examination after completed treatment for conditions other than malignant neoplasm: Secondary | ICD-10-CM

## 2016-11-07 DIAGNOSIS — Z98891 History of uterine scar from previous surgery: Secondary | ICD-10-CM

## 2016-11-07 DIAGNOSIS — Z9889 Other specified postprocedural states: Secondary | ICD-10-CM

## 2016-11-07 NOTE — Progress Notes (Signed)
   Subjective:  Lanitra Province is a 36 y.o. female now 5 days status post C-Section.    Review of Systems Negative   Diet:   normal   Bowel movements : normal.  The patient is not having any pain.  Objective:  BP 110/64 (BP Location: Right Arm, Patient Position: Sitting, Cuff Size: Normal)   Pulse 84   Wt 180 lb (81.6 kg)   LMP 01/31/2016   Breastfeeding? Yes   BMI 29.95 kg/m  General:Well developed, well nourished.  No acute distress. Abdomen: Bowel sounds normal, soft, non-tender. Pelvic Exam: not indicated   Incision(s):   Healing well, no drainage, slight erythema, no hernia, no swelling, no dehiscence,     Assessment:  Post-Op 5 days s/p C-section.  Pt doing well postoperatively.   Plan:  1.Wound care discussed   2. current medications: none 3. Activity restrictions: none 4. return to work: not applicable. 5. Follow up in 4 weeks, call in 1 week if leg still hurts  By signing my name below, I, Izna Ahmed, attest that this documentation has been prepared under the direction and in the presence of Jonnie Kind, MD. Electronically Signed: Jabier Gauss, ED Scribe. 11/07/16. 3:08 PM.  I personally performed the services described in this documentation, which was SCRIBED in my presence. The recorded information has been reviewed and considered accurate. It has been edited as necessary during review. Jonnie Kind, MD

## 2016-12-05 ENCOUNTER — Encounter: Payer: Medicaid Other | Admitting: Obstetrics and Gynecology

## 2016-12-05 ENCOUNTER — Ambulatory Visit: Payer: Medicaid Other | Admitting: Obstetrics and Gynecology

## 2016-12-12 ENCOUNTER — Ambulatory Visit (INDEPENDENT_AMBULATORY_CARE_PROVIDER_SITE_OTHER): Payer: Medicaid Other | Admitting: Obstetrics and Gynecology

## 2016-12-12 ENCOUNTER — Encounter: Payer: Self-pay | Admitting: Obstetrics and Gynecology

## 2016-12-12 NOTE — Progress Notes (Addendum)
Patient ID: Kayla Walker, female   DOB: [birth year], 36 y.o.   MRN: [id number] Subjective:  Kayla Walker is a 36 y.o. female who presents for a 6 weeks 5 days postpartum visit  Patient concerns: She notes that she would like a refill of her prenatal vitamins. She states that she is breast and bottle feeding.  Prenatal and intrapartum course notable for scheduled repeat C-section and she presented in labor and had a C-section completed. She notes that her plans for contraceptive are using condoms.   no depression Patient is sexually active.   The following portions of the patient's history were reviewed and updated as appropriate: allergies, current medications, past family history, past medical history, past surgical history and problem list.  Review of Systems    See Subjective, otherwise negative ROS.  Objective:  BP 112/62 (BP Location: Right Arm, Patient Position: Sitting, Cuff Size: Normal)   Pulse 74   Ht 5\' 5"  (1.651 m)   Wt 184 lb (83.5 kg)   Breastfeeding? Yes   BMI 30.62 kg/m   General:  alert, cooperative and no distress     Lungs: clear to auscultation bilaterally  Heart:  regular rate and rhythm, S1, S2 normal, no murmur  Abdomen: soft, non-tender; bowel sounds normal; no masses,  no organomegaly   Vulva:  normal  Vagina: normal vagina. Nl secretions.  Cervix:  Normal. Well supported.   Corpus: normal size, contour, position, consistency, mobility, non-tender  Adnexa:  normal adnexa          Assessment:  1.  Postpartum exam.  2. Contraception: condoms  Plan:  1. Refill prenatal vitamin Rx 2. Follow up in 3 years  By signing my name below, I, Soijett Blue, attest that this documentation has been prepared under the direction and in the presence of Jonnie Kind, MD. Electronically Signed: Soijett Blue, ED Scribe. 12/12/16. 11:25 AM.  I personally performed the services described in this documentation, which was SCRIBED in my presence. The recorded information  has been reviewed and considered accurate. It has been edited as necessary during review. Jonnie Kind, MD

## 2020-06-09 ENCOUNTER — Ambulatory Visit (INDEPENDENT_AMBULATORY_CARE_PROVIDER_SITE_OTHER): Payer: Medicaid Other | Admitting: Adult Health

## 2020-06-09 ENCOUNTER — Encounter: Payer: Self-pay | Admitting: Adult Health

## 2020-06-09 ENCOUNTER — Other Ambulatory Visit: Payer: Self-pay

## 2020-06-09 VITALS — BP 127/76 | HR 98 | Ht 65.0 in | Wt 188.5 lb

## 2020-06-09 DIAGNOSIS — Z98891 History of uterine scar from previous surgery: Secondary | ICD-10-CM

## 2020-06-09 DIAGNOSIS — Z3A13 13 weeks gestation of pregnancy: Secondary | ICD-10-CM | POA: Insufficient documentation

## 2020-06-09 DIAGNOSIS — O3680X0 Pregnancy with inconclusive fetal viability, not applicable or unspecified: Secondary | ICD-10-CM | POA: Diagnosis not present

## 2020-06-09 DIAGNOSIS — Z3201 Encounter for pregnancy test, result positive: Secondary | ICD-10-CM | POA: Insufficient documentation

## 2020-06-09 DIAGNOSIS — O09522 Supervision of elderly multigravida, second trimester: Secondary | ICD-10-CM | POA: Diagnosis not present

## 2020-06-09 LAB — POCT URINE PREGNANCY: Preg Test, Ur: POSITIVE — AB

## 2020-06-09 NOTE — Progress Notes (Signed)
  Subjective:     Patient ID: Kayla Walker, female   DOB: Aug 30, 1980, 40 y.o.   MRN: [id number]  HPI [place] is a 40 year old black female,married, in for UPT, has missed periods and had 2+HPTs. She has had 2 prior C sections. She is from [place].   Review of Systems +missed periods and 2+HPTs Occasional headache  +breast tenderness  Reviewed past medical,surgical, social and family history. Reviewed medications and allergies.     Objective:   Physical Exam BP 127/76 (BP Location: Left Arm, Patient Position: Sitting, Cuff Size: Normal)   Pulse 98   Ht 5\' 5"  (1.651 m)   Wt 188 lb 8 oz (85.5 kg)   LMP 03/05/2020   Breastfeeding No   BMI 31.37 kg/m UPT is +, about 13+5 weeks bu LM with EDD 12/10/20. Skin warm and dry. Neck: mid line trachea, normal thyroid, good ROM, no lymphadenopathy noted. Lungs: clear to ausculation bilaterally. Cardiovascular: regular rate and rhythm.Abdomen is soft and non tender. AA is 2 Fall risk is low PHQ 9 score is 2 GAD 7 score is 1  Upstream - 06/09/20 0850      Pregnancy Intention Screening   Does the patient want to become pregnant in the next year? N/A    Does the patient's partner want to become pregnant in the next year? N/A    Would the patient like to discuss contraceptive options today? N/A      Contraception Wrap Up   Current Method Pregnant/Seeking Pregnancy    End Method Pregnant/Seeking Pregnancy    Contraception Counseling Provided No             Assessment:     1. Pregnancy examination or test, positive result Continue PNV  2. [redacted] weeks gestation of pregnancy   3. Encounter to determine fetal viability of pregnancy, single or unspecified fetus Dating [place] in 1 week   4. History of cesarean delivery Has had 2 prior C-sections  5. Multigravida of advanced maternal age in second trimester     Plan:     Review handout by [facility]

## 2020-06-27 ENCOUNTER — Other Ambulatory Visit: Payer: Self-pay | Admitting: Adult Health

## 2020-06-27 DIAGNOSIS — O3680X0 Pregnancy with inconclusive fetal viability, not applicable or unspecified: Secondary | ICD-10-CM

## 2020-06-27 DIAGNOSIS — Z363 Encounter for antenatal screening for malformations: Secondary | ICD-10-CM

## 2020-06-28 ENCOUNTER — Ambulatory Visit (INDEPENDENT_AMBULATORY_CARE_PROVIDER_SITE_OTHER): Payer: Medicaid Other

## 2020-06-28 ENCOUNTER — Other Ambulatory Visit: Payer: Self-pay | Admitting: Adult Health

## 2020-06-28 ENCOUNTER — Other Ambulatory Visit: Payer: Self-pay

## 2020-06-28 DIAGNOSIS — O3680X0 Pregnancy with inconclusive fetal viability, not applicable or unspecified: Secondary | ICD-10-CM

## 2020-06-28 DIAGNOSIS — Z3A16 16 weeks gestation of pregnancy: Secondary | ICD-10-CM

## 2020-06-28 DIAGNOSIS — Z363 Encounter for antenatal screening for malformations: Secondary | ICD-10-CM

## 2020-06-28 NOTE — Progress Notes (Signed)
[place] 16+3 wks,breech,anterior placenta,normal ovaries,fhr 140 bpm,bilat mult small choroid plexus cysts,svp of fluid 4.3 cm,cx 4.2 cm,limited dating ultrasound will need anatomy scan,efw 172 g 70%,EDD 12/10/2020 by LMP

## 2020-07-18 ENCOUNTER — Other Ambulatory Visit: Payer: Self-pay | Admitting: Obstetrics & Gynecology

## 2020-07-18 DIAGNOSIS — Z363 Encounter for antenatal screening for malformations: Secondary | ICD-10-CM

## 2020-07-19 ENCOUNTER — Other Ambulatory Visit: Payer: Self-pay

## 2020-07-19 ENCOUNTER — Ambulatory Visit (INDEPENDENT_AMBULATORY_CARE_PROVIDER_SITE_OTHER): Payer: Medicaid Other

## 2020-07-19 DIAGNOSIS — Z363 Encounter for antenatal screening for malformations: Secondary | ICD-10-CM

## 2020-07-19 NOTE — Progress Notes (Signed)
[place] 19+3 wks,transverse head left,anterior low lying placenta gr 0,tip of placenta to cx  8 mm,cx 4.9 cm,fhr 150 bpm,svp of fluid 3.9 cm,normal ovaries,EFW 332 g 81%,anatomy complete

## 2020-07-21 ENCOUNTER — Encounter: Payer: Self-pay | Admitting: Women's Health

## 2020-07-21 ENCOUNTER — Ambulatory Visit (INDEPENDENT_AMBULATORY_CARE_PROVIDER_SITE_OTHER): Payer: Medicaid Other | Admitting: Women's Health

## 2020-07-21 ENCOUNTER — Ambulatory Visit: Payer: Medicaid Other | Admitting: *Deleted

## 2020-07-21 ENCOUNTER — Other Ambulatory Visit: Payer: Self-pay

## 2020-07-21 VITALS — BP 135/75 | HR 77 | Wt 191.0 lb

## 2020-07-21 DIAGNOSIS — Z8632 Personal history of gestational diabetes: Secondary | ICD-10-CM | POA: Diagnosis not present

## 2020-07-21 DIAGNOSIS — O09299 Supervision of pregnancy with other poor reproductive or obstetric history, unspecified trimester: Secondary | ICD-10-CM | POA: Diagnosis not present

## 2020-07-21 DIAGNOSIS — O4440 Low lying placenta NOS or without hemorrhage, unspecified trimester: Secondary | ICD-10-CM | POA: Diagnosis not present

## 2020-07-21 DIAGNOSIS — Z3480 Encounter for supervision of other normal pregnancy, unspecified trimester: Secondary | ICD-10-CM

## 2020-07-21 DIAGNOSIS — Z3A19 19 weeks gestation of pregnancy: Secondary | ICD-10-CM | POA: Diagnosis not present

## 2020-07-21 DIAGNOSIS — Z3490 Encounter for supervision of normal pregnancy, unspecified, unspecified trimester: Secondary | ICD-10-CM | POA: Insufficient documentation

## 2020-07-21 DIAGNOSIS — Z3482 Encounter for supervision of other normal pregnancy, second trimester: Secondary | ICD-10-CM

## 2020-07-21 DIAGNOSIS — Z98891 History of uterine scar from previous surgery: Secondary | ICD-10-CM

## 2020-07-21 DIAGNOSIS — O0990 Supervision of high risk pregnancy, unspecified, unspecified trimester: Secondary | ICD-10-CM | POA: Insufficient documentation

## 2020-07-21 LAB — POCT URINALYSIS DIPSTICK OB
Blood, UA: NEGATIVE
Glucose, UA: NEGATIVE
Ketones, UA: NEGATIVE
Leukocytes, UA: NEGATIVE
Nitrite, UA: NEGATIVE
POC,PROTEIN,UA: NEGATIVE

## 2020-07-21 MED ORDER — ASPIRIN 81 MG PO TBEC: 81.0000 mg | DELAYED_RELEASE_TABLET | Freq: Every day | ORAL | 3 refills | Status: DC

## 2020-07-21 NOTE — Patient Instructions (Addendum)
Kayla Walker, I greatly value your feedback.  If you receive a survey following your visit with [place] today, we appreciate you taking the time to fill it out.  Thanks, Knute Neu, CNM, WHNP-BC  [facility] ([place], [place] [address]) Entrance C, located off of [place] parking  Go to [email or web address] to register for FREE online childbirth classes  [place] Pediatricians/Family Doctors:  [facility] [place] [phone]                 [place] [phone] (usually not accepting new patients unless you have family there already, you are always welcome to call and ask)       [facility] Department [phone]       [facility] Pediatricians/Family Doctors:   [facility]: [phone]  [facility]: [phone]  [facility]: [place] Doctors:   [facility]: [place] [facility]: [place]:  [place]: [phone]    Home Blood Pressure Monitoring for Patients   Your provider has recommended that you check your blood pressure (BP) at least once a week at home. If you do not have a blood pressure cuff at home, one will be provided for you. Contact your provider if you have not received your monitor within 1 week.   Helpful Tips for Accurate Home Blood Pressure Checks  . Don't smoke, exercise, or drink caffeine 30 minutes before checking your BP . Use the restroom before checking your BP (a full bladder can raise your pressure) . Relax in a comfortable upright chair . Feet on the ground . Left arm resting comfortably on a flat surface at the level of your heart . Legs uncrossed . Back supported . Sit quietly and don't talk . Place the cuff on your bare arm . Adjust snuggly, so  that only two fingertips can fit between your skin and the top of the cuff . Check 2 readings separated by at least one minute . Keep a log of your BP readings . For a visual, please reference this diagram: http://ccnc.care/bpdiagram  Provider Name: [facility]     Phone: [phone]  Zone 1: ALL CLEAR  Continue to monitor your symptoms:  . BP reading is less than 140 (top number) or less than 90 (bottom number)  . No right upper stomach pain . No headaches or seeing spots . No feeling nauseated or throwing up . No swelling in face and hands  Zone 2: CAUTION Call your doctor's office for any of the following:  . BP reading is greater than 140 (top number) or greater than 90 (bottom number)  . Stomach pain under your ribs in the middle or right side . Headaches or seeing spots . Feeling nauseated or throwing up . Swelling in face and hands  Zone 3: EMERGENCY  Seek immediate medical care if you have any of the following:  . BP reading is greater than160 (top number) or greater than 110 (bottom number) . Severe headaches not improving with Tylenol . Serious difficulty catching your breath . Any worsening symptoms from Zone 2     Second Trimester of Pregnancy The second trimester is from week 14 through week 27 (months 4 through 6). The second trimester is often a time when you feel your best.  Your body has adjusted to being pregnant, and you begin to feel better physically. Usually, morning sickness has lessened or quit completely, you may have more energy, and you may have an increase in appetite. The second trimester is also a time when the fetus is growing rapidly. At the end of the sixth month, the fetus is about 9 inches long and weighs about 1 pounds. You will likely begin to feel the baby move (quickening) between 16 and 20 weeks of pregnancy. Body changes during your second trimester Your body continues to go through many changes during your second trimester. The  changes vary from woman to woman.  Your weight will continue to increase. You will notice your lower abdomen bulging out.  You may begin to get stretch marks on your hips, abdomen, and breasts.  You may develop headaches that can be relieved by medicines. The medicines should be approved by your health care provider.  You may urinate more often because the fetus is pressing on your bladder.  You may develop or continue to have heartburn as a result of your pregnancy.  You may develop constipation because certain hormones are causing the muscles that push waste through your intestines to slow down.  You may develop hemorrhoids or swollen, bulging veins (varicose veins).  You may have back pain. This is caused by: ? Weight gain. ? Pregnancy hormones that are relaxing the joints in your pelvis. ? A shift in weight and the muscles that support your balance.  Your breasts will continue to grow and they will continue to become tender.  Your gums may bleed and may be sensitive to brushing and flossing.  Dark spots or blotches (chloasma, mask of pregnancy) may develop on your face. This will likely fade after the baby is born.  A dark line from your belly button to the pubic area (linea nigra) may appear. This will likely fade after the baby is born.  You may have changes in your hair. These can include thickening of your hair, rapid growth, and changes in texture. Some women also have hair loss during or after pregnancy, or hair that feels dry or thin. Your hair will most likely return to normal after your baby is born.  What to expect at prenatal visits During a routine prenatal visit:  You will be weighed to make sure you and the fetus are growing normally.  Your blood pressure will be taken.  Your abdomen will be measured to track your baby's growth.  The fetal heartbeat will be listened to.  Any test results from the previous visit will be discussed.  Your health care  provider may ask you:  How you are feeling.  If you are feeling the baby move.  If you have had any abnormal symptoms, such as leaking fluid, bleeding, severe headaches, or abdominal cramping.  If you are using any tobacco products, including cigarettes, chewing tobacco, and electronic cigarettes.  If you have any questions.  Other tests that may be performed during your second trimester include:  Blood tests that check for: ? Low iron levels (anemia). ? High blood sugar that affects pregnant women (gestational diabetes) between 64 and 28 weeks. ? Rh antibodies. This is to check for a protein on red blood cells (Rh factor).  Urine tests to check for infections, diabetes, or protein in the urine.  An ultrasound to confirm the proper growth and development of the baby.  An amniocentesis to check for possible genetic problems.  Fetal screens  for spina bifida and Down syndrome.  HIV (human immunodeficiency virus) testing. Routine prenatal testing includes screening for HIV, unless you choose not to have this test.  Follow these instructions at home: Medicines  Follow your health care provider's instructions regarding medicine use. Specific medicines may be either safe or unsafe to take during pregnancy.  Take a prenatal vitamin that contains at least 600 micrograms (mcg) of folic acid.  If you develop constipation, try taking a stool softener if your health care provider approves. Eating and drinking  Eat a balanced diet that includes fresh fruits and vegetables, whole grains, good sources of protein such as meat, eggs, or tofu, and low-fat dairy. Your health care provider will help you determine the amount of weight gain that is right for you.  Avoid raw meat and uncooked cheese. These carry germs that can cause birth defects in the baby.  If you have low calcium intake from food, talk to your health care provider about whether you should take a daily calcium  supplement.  Limit foods that are high in fat and processed sugars, such as fried and sweet foods.  To prevent constipation: ? Drink enough fluid to keep your urine clear or pale yellow. ? Eat foods that are high in fiber, such as fresh fruits and vegetables, whole grains, and beans. Activity  Exercise only as directed by your health care provider. Most women can continue their usual exercise routine during pregnancy. Try to exercise for 30 minutes at least 5 days a week. Stop exercising if you experience uterine contractions.  Avoid heavy lifting, wear low heel shoes, and practice good posture.  A sexual relationship may be continued unless your health care provider directs you otherwise. Relieving pain and discomfort  Wear a good support bra to prevent discomfort from breast tenderness.  Take warm sitz baths to soothe any pain or discomfort caused by hemorrhoids. Use hemorrhoid cream if your health care provider approves.  Rest with your legs elevated if you have leg cramps or low back pain.  If you develop varicose veins, wear support hose. Elevate your feet for 15 minutes, 3-4 times a day. Limit salt in your diet. Prenatal Care  Write down your questions. Take them to your prenatal visits.  Keep all your prenatal visits as told by your health care provider. This is important. Safety  Wear your seat belt at all times when driving.  Make a list of emergency phone numbers, including numbers for family, friends, the hospital, and police and fire departments. General instructions  Ask your health care provider for a referral to a local prenatal education class. Begin classes no later than the beginning of month 6 of your pregnancy.  Ask for help if you have counseling or nutritional needs during pregnancy. Your health care provider can offer advice or refer you to specialists for help with various needs.  Do not use hot tubs, steam rooms, or saunas.  Do not douche or use  tampons or scented sanitary pads.  Do not cross your legs for long periods of time.  Avoid cat litter boxes and soil used by cats. These carry germs that can cause birth defects in the baby and possibly loss of the fetus by miscarriage or stillbirth.  Avoid all smoking, herbs, alcohol, and unprescribed drugs. Chemicals in these products can affect the formation and growth of the baby.  Do not use any products that contain nicotine or tobacco, such as cigarettes and e-cigarettes. If you need help quitting,  ask your health care provider.  Visit your dentist if you have not gone yet during your pregnancy. Use a soft toothbrush to brush your teeth and be gentle when you floss. Contact a health care provider if:  You have dizziness.  You have mild pelvic cramps, pelvic pressure, or nagging pain in the abdominal area.  You have persistent nausea, vomiting, or diarrhea.  You have a bad smelling vaginal discharge.  You have pain when you urinate. Get help right away if:  You have a fever.  You are leaking fluid from your vagina.  You have spotting or bleeding from your vagina.  You have severe abdominal cramping or pain.  You have rapid weight gain or weight loss.  You have shortness of breath with chest pain.  You notice sudden or extreme swelling of your face, hands, ankles, feet, or legs.  You have not felt your baby move in over an hour.  You have severe headaches that do not go away when you take medicine.  You have vision changes. Summary  The second trimester is from week 14 through week 27 (months 4 through 6). It is also a time when the fetus is growing rapidly.  Your body goes through many changes during pregnancy. The changes vary from woman to woman.  Avoid all smoking, herbs, alcohol, and unprescribed drugs. These chemicals affect the formation and growth your baby.  Do not use any tobacco products, such as cigarettes, chewing tobacco, and e-cigarettes. If you  need help quitting, ask your health care provider.  Contact your health care provider if you have any questions. Keep all prenatal visits as told by your health care provider. This is important. This information is not intended to replace advice given to you by your health care provider. Make sure you discuss any questions you have with your health care provider. Document Released: 04/03/2001 Document Revised: 09/15/2015 Document Reviewed: 06/10/2012 Elsevier Interactive Patient Education  2017 [place]  The placenta is the organ formed during pregnancy. It carries oxygen and nutrients to the unborn baby (fetus). The placenta is the baby's life support system. Placenta previa happens when the placenta implants in the lower part of the uterus. The placenta either partially or completely covers the opening to the cervix. This can cause severe bleeding during late pregnancy or delivery. If placenta previa is diagnosed in the first half of pregnancy, the placenta may move into a normal position as the pregnancy progresses. It is important to keep all prenatal visits with your health care provider so that you can be closely monitored. What are the causes? The cause of this condition is not known. What increases the risk? The following factors may make you more likely to develop this condition:  Having scars on the lining of the uterus.  Having had previous pregnancies.  Having had surgeries involving the uterus, such as a cesarean delivery.  Having a history of placenta previa.  Having smoked or used cocaine during pregnancy.  Being 46 years of age or older during pregnancy. What are the signs or symptoms? The main symptom of this condition is sudden, painless, bright red vaginal bleeding during the second half of pregnancy. The amount of bleeding can be very light at first, and it usually stops on its own. Heavier bleeding episodes may also happen. Some women with  placenta previa may have no bleeding at all. How is this diagnosed? This condition may be diagnosed during a routine ultrasound or during  a checkup after vaginal bleeding is noticed. In general:  If you are diagnosed with placenta previa, digital exams, which use the fingers, will be avoided. Your health care provider will still perform a speculum exam.  If you did not have an ultrasound during your pregnancy, placenta previa may not be diagnosed until bleeding occurs during labor. How is this treated? Treatment for this condition depends on:  How much you are bleeding, or whether the bleeding has stopped.  How far along you are in your pregnancy.  The condition of your baby.  How much of the placenta is covering the cervix. Treatment may include:  Decreased activity.  Bed rest at home or in the hospital.  Pelvic rest. Nothing is placed inside the vagina during pelvic rest. This means not having sex, not using tampons, and not using douches.  A blood transfusion to replace blood that you have lost (maternal blood loss).  A cesarean delivery. This may be performed if: ? The bleeding is heavy and cannot be controlled. ? The placenta completely covers the cervix.  Medicines to stop premature labor or to help the baby's lungs to mature. This treatment may be used if you need to deliver your baby before your pregnancy is full-term. Follow these instructions at home:  Get plenty of rest and reduce activity as told by your health care provider.  If told by your health care provider, stay on bed rest for the length of time that is recommended.  Do not have sex, use tampons, douche, or place anything inside of your vagina if your health care provider recommends pelvic rest.  Take over-the-counter and prescription medicines only as told by your health care provider.  Keep all follow-up visits. This is important. Get help right away if:  You have vaginal bleeding, even if in small  amounts and even if you have no pain.  You have cramping or regular contractions.  You have pain in your abdomen or your lower back.  You have a feeling of increased pressure in your pelvis.  You have increased watery or bloody mucus from your vagina.  You have not felt your baby moving regularly. Summary  Placenta previa is a condition in which the placenta implants in the lower part of the uterus in a pregnant woman.  The cause of this condition is not known.  The most common symptom of placenta previa is painless, bright red bleeding during pregnancy.  It is important to keep all prenatal visits with your health care provider so you can be closely monitored.  Get help right away if you have placenta previa and you are experiencing bleeding during pregnancy. This information is not intended to replace advice given to you by your health care provider. Make sure you discuss any questions you have with your health care provider. Document Revised: 11/30/2019 Document Reviewed: 11/30/2019 Elsevier Patient Education  2021 [employer].

## 2020-07-21 NOTE — Progress Notes (Signed)
INITIAL OBSTETRICAL VISIT Patient name: Kayla Walker MRN [id number]  Date of birth: 1980/06/11 Chief Complaint:   Initial Prenatal Visit   History of Present Illness:   Kayla Walker is a 40 y.o. G24P2002 African American female at [redacted]w[redacted]d by LMP c/w u/s at 16 weeks with an Estimated Date of Delivery: 12/10/20 being seen today for her initial obstetrical visit.   Her obstetrical history is significant for 1st: term PCS w/ Rt lateral extension and 1 layter closure for AOD, pushed x 2hrs, 8lb8oz, OA. 2nd: GDM and ERCS.   Today she reports no complaints. Wants to try for vaginal birth this time.  Depression screen [facility] 2/9 07/21/2020 06/09/2020 09/13/2016 04/24/2016  Decreased Interest 1 0 0 0  Down, Depressed, Hopeless 0 0 0 0  PHQ - 2 Score 1 0 0 0  Altered sleeping 1 1 - 0  Tired, decreased energy 1 0 - 0  Change in appetite 0 1 - 1  Feeling bad or failure about yourself  0 0 - 0  Trouble concentrating 0 0 - 0  Moving slowly or fidgety/restless 0 0 - 0  Suicidal thoughts 0 0 - 0  PHQ-9 Score 3 2 - 1    Patient's last menstrual period was 03/05/2020. Last pap unsure. Results were: unsure  Review of Systems:   Pertinent items are noted in HPI Denies cramping/contractions, leakage of fluid, vaginal bleeding, abnormal vaginal discharge w/ itching/odor/irritation, headaches, visual changes, shortness of breath, chest pain, abdominal pain, severe nausea/vomiting, or problems with urination or bowel movements unless otherwise stated above.  Pertinent History Reviewed:  Reviewed past medical,surgical, social, obstetrical and family history.  Reviewed problem list, medications and allergies. OB History  Gravida Para Term Preterm AB Living  3 2 2     2   SAB IAB Ectopic Multiple Live Births        0 2    # Outcome Date GA Lbr Len/2nd Weight Sex Delivery Anes PTL Lv  3 Current           2 Term 10/26/16 [redacted]w[redacted]d  7 lb 12.9 oz (3.54 kg) F CS-LTranv Spinal N LIV     Complications: Gestational diabetes   1 Term 08/24/15 [redacted]w[redacted]d  8 lb 8 oz (3.856 kg) F CS-LTranv EPI N LIV     Complications: Failure to Progress in Second Stage   Physical Assessment:   Vitals:   07/21/20 1047  BP: 135/75  Pulse: 77  Weight: 191 lb (86.6 kg)  Body mass index is 31.78 kg/m.       Physical Examination:  General appearance - well appearing, and in no distress  Mental status - alert, oriented to person, place, and time  Psych:  She has a normal mood and affect  Skin - warm and dry, normal color, no suspicious lesions noted  Chest - effort normal, all lung fields clear to auscultation bilaterally  Heart - normal rate and regular rhythm  Abdomen - soft, nontender  Extremities:  No swelling or varicosities noted  Thin prep pap is not done-pt had wanted to wait til next visit, but also has low-lying placenta  Chaperone: N/A    TODAY'S FHR: 169 via doppler  Results for orders placed or performed in visit on 07/21/20 (from the past 24 hour(s))  POC Urinalysis Dipstick OB   Collection Time: 07/21/20 11:23 AM  Result Value Ref Range   Color, UA     Clarity, UA     Glucose, UA Negative Negative  Bilirubin, UA     Ketones, UA neg    Spec Grav, UA     Blood, UA neg    pH, UA     POC,PROTEIN,UA Negative Negative, Trace, Small (1+), Moderate (2+), Large (3+), 4+   Urobilinogen, UA     Nitrite, UA neg    Leukocytes, UA Negative Negative   Appearance     Odor      Assessment & Plan:  1) Low-Risk Pregnancy G3P2002 at [redacted]w[redacted]d with an Estimated Date of Delivery: 12/10/20   2) Initial OB visit  3) Prev c/s x 2> wants TOLAC, discussed increased r/f rupture w/ 2 C/S, would need to go into labor and progress on her own. To discuss further w/ MD during pregnancy.   4) H/O GDM> A1C today  5) Low-lying placenta> 37mm from cx, discussed w/ pt/partner, pelvic rest, reviewed reasons to seek care. Repeat @ 28wks  Meds:  Meds ordered this encounter  Medications  . aspirin 81 MG EC tablet    Sig: Take 1 tablet  (81 mg total) by mouth daily. Swallow whole.    Dispense:  90 tablet    Refill:  3    Order Specific Question:   Supervising Provider    Answer:   Tania Ade H [[id number]]    Initial labs obtained Continue prenatal vitamins Reviewed n/v relief measures and warning s/s to report Reviewed recommended weight gain based on pre-gravid BMI Encouraged well-balanced diet Genetic & carrier screening discussed: requests Panorama and AFP, declines Horizon 14  Ultrasound discussed; fetal survey: results reviewed [place] completed> form faxed if has or is planning to apply for medicaid The nature of [place] for [employer] with multiple MDs and other Advanced Practice Providers was explained to patient; also emphasized that fellows, residents, and students are part of our team. Does not have home bp cuff. Office bp cuff given: none available.   Indications for ASA therapy (per uptodate) OR Two or more of the following: Obesity (BMI>30 kg/m2) Yes  Age ?35 years Yes Sociodemographic characteristics (African American race, low socioeconomic level) Yes   Follow-up: Return in about 4 weeks (around 08/18/2020) for LROB, CNM in person.   Orders Placed This Encounter  Procedures  . Urine Culture  . GC/Chlamydia Probe Amp  . Genetic Screening  . AFP, Serum, Open Spina Bifida  . Pain Management Screening Profile (10S)  . CBC/D/Plt+RPR+Rh+ABO+Rub Ab...  . Hemoglobin A1c  . POC Urinalysis Dipstick OB    Roma Schanz CNM, [facility] 07/21/2020 12:14 PM

## 2020-07-22 LAB — PMP SCREEN PROFILE (10S), URINE
Amphetamine Scrn, Ur: NEGATIVE ng/mL
BARBITURATE SCREEN URINE: NEGATIVE ng/mL
BENZODIAZEPINE SCREEN, URINE: NEGATIVE ng/mL
CANNABINOIDS UR QL SCN: NEGATIVE ng/mL
Cocaine (Metab) Scrn, Ur: NEGATIVE ng/mL
Creatinine(Crt), U: 73.2 mg/dL (ref 20.0–300.0)
Methadone Screen, Urine: NEGATIVE ng/mL
OXYCODONE+OXYMORPHONE UR QL SCN: NEGATIVE ng/mL
Opiate Scrn, Ur: NEGATIVE ng/mL
Ph of Urine: 6.5 (ref 4.5–8.9)
Phencyclidine Qn, Ur: NEGATIVE ng/mL
Propoxyphene Scrn, Ur: NEGATIVE ng/mL

## 2020-07-22 LAB — CBC/D/PLT+RPR+RH+ABO+RUB AB...
Antibody Screen: NEGATIVE
HCV Ab: <0.1 s/co ratio (ref 0.0–0.9)
Hepatitis B Surface Ag: NEGATIVE
Immature Granulocytes: 0 %
Lymphocytes Absolute: 1.5 10*3/uL (ref 0.7–3.1)
Lymphs: 21 %

## 2020-07-22 LAB — GC/CHLAMYDIA PROBE AMP
Chlamydia trachomatis, NAA: NEGATIVE
Neisseria Gonorrhoeae by PCR: NEGATIVE

## 2020-07-22 LAB — AFP, SERUM, OPEN SPINA BIFIDA

## 2020-07-23 LAB — AFP, SERUM, OPEN SPINA BIFIDA
AFP MoM: 0.88
AFP Value: 46.2 ng/mL
Maternal Age At EDD: 38.2 yr
OSBR Risk 1 IN: 10000
Test Results:: NEGATIVE
Weight: 191 [lb_av]

## 2020-07-23 LAB — CBC/D/PLT+RPR+RH+ABO+RUB AB...
Basophils Absolute: 0 10*3/uL (ref 0.0–0.2)
Basos: 0 %
EOS (ABSOLUTE): 0.1 10*3/uL (ref 0.0–0.4)
Eos: 1 %
HIV Screen 4th Generation wRfx: NONREACTIVE
Hematocrit: 40.7 % (ref 34.0–46.6)
Hemoglobin: 13.6 g/dL (ref 11.1–15.9)
Immature Grans (Abs): 0 10*3/uL (ref 0.0–0.1)
MCH: 28.7 pg (ref 26.6–33.0)
MCHC: 33.4 g/dL (ref 31.5–35.7)
MCV: 86 fL (ref 79–97)
Monocytes Absolute: 0.4 10*3/uL (ref 0.1–0.9)
Monocytes: 5 %
Neutrophils Absolute: 5.3 10*3/uL (ref 1.4–7.0)
Neutrophils: 73 %
Platelets: 285 10*3/uL (ref 150–450)
RBC: 4.74 x10E6/uL (ref 3.77–5.28)
RDW: 13.3 % (ref 11.7–15.4)
RPR Ser Ql: NONREACTIVE
Rh Factor: POSITIVE
Rubella Antibodies, IGG: 2.68 index (ref >0.99)
WBC: 7.3 10*3/uL (ref 3.4–10.8)

## 2020-07-23 LAB — URINE CULTURE

## 2020-07-23 LAB — HCV INTERPRETATION

## 2020-07-23 LAB — HEMOGLOBIN A1C
Est. average glucose Bld gHb Est-mCnc: 126 mg/dL
Hgb A1c MFr Bld: 6 % — ABNORMAL HIGH (ref 4.8–5.6)

## 2020-08-18 ENCOUNTER — Encounter: Payer: Medicaid Other | Admitting: Women's Health

## 2020-09-25 ENCOUNTER — Inpatient Hospital Stay (HOSPITAL_BASED_OUTPATIENT_CLINIC_OR_DEPARTMENT_OTHER): Payer: Medicaid Other

## 2020-09-25 ENCOUNTER — Inpatient Hospital Stay (HOSPITAL_COMMUNITY)
Admission: AD | Admit: 2020-09-25 | Discharge: 2020-09-25 | Disposition: A | Discharge status: Home / Self Care | Payer: Medicaid Other | Attending: Obstetrics and Gynecology | Admitting: Obstetrics and Gynecology

## 2020-09-25 ENCOUNTER — Other Ambulatory Visit: Payer: Self-pay

## 2020-09-25 ENCOUNTER — Encounter (HOSPITAL_COMMUNITY): Payer: Self-pay | Admitting: Emergency Medicine

## 2020-09-25 DIAGNOSIS — O4693 Antepartum hemorrhage, unspecified, third trimester: Secondary | ICD-10-CM | POA: Insufficient documentation

## 2020-09-25 DIAGNOSIS — N939 Abnormal uterine and vaginal bleeding, unspecified: Secondary | ICD-10-CM

## 2020-09-25 DIAGNOSIS — O09293 Supervision of pregnancy with other poor reproductive or obstetric history, third trimester: Secondary | ICD-10-CM

## 2020-09-25 DIAGNOSIS — O34219 Maternal care for unspecified type scar from previous cesarean delivery: Secondary | ICD-10-CM | POA: Insufficient documentation

## 2020-09-25 DIAGNOSIS — Z3A29 29 weeks gestation of pregnancy: Secondary | ICD-10-CM | POA: Diagnosis not present

## 2020-09-25 DIAGNOSIS — O4443 Low lying placenta NOS or without hemorrhage, third trimester: Secondary | ICD-10-CM | POA: Diagnosis not present

## 2020-09-25 DIAGNOSIS — Z3A28 28 weeks gestation of pregnancy: Secondary | ICD-10-CM | POA: Diagnosis not present

## 2020-09-25 DIAGNOSIS — O468X3 Other antepartum hemorrhage, third trimester: Secondary | ICD-10-CM | POA: Diagnosis not present

## 2020-09-25 IMAGING — US US MFM OB LIMITED
1 series · 15 of 28 positions shown · non-contrast
Comparison: none

[Series 1: us mfm ob limited · 38 acquisitions, 15 frames shown]
[im 1/38]
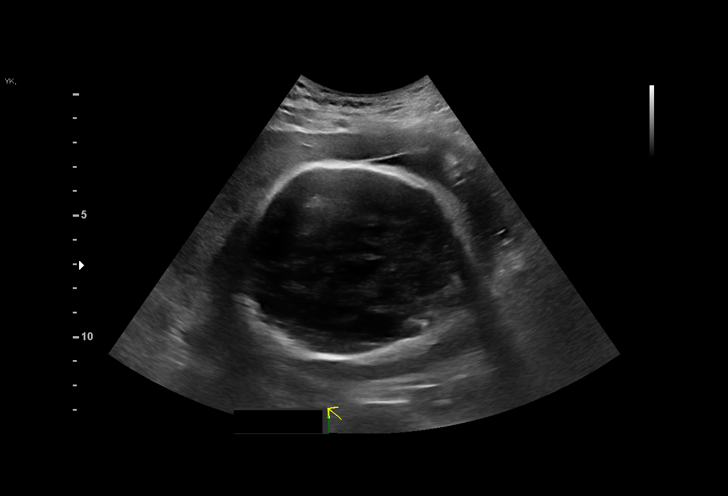
[im 3/38]
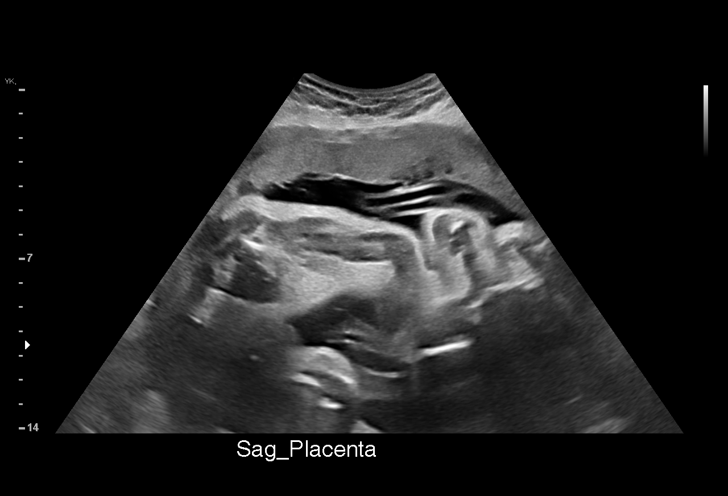
[im 6/38]
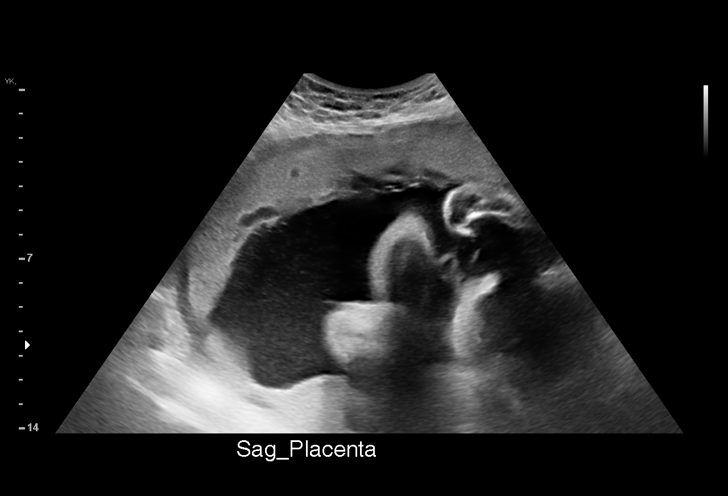
[im 9/38]
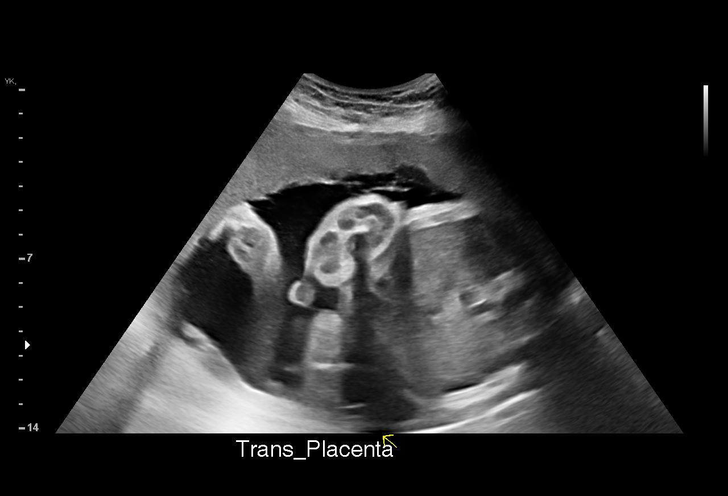
[im 11/38]
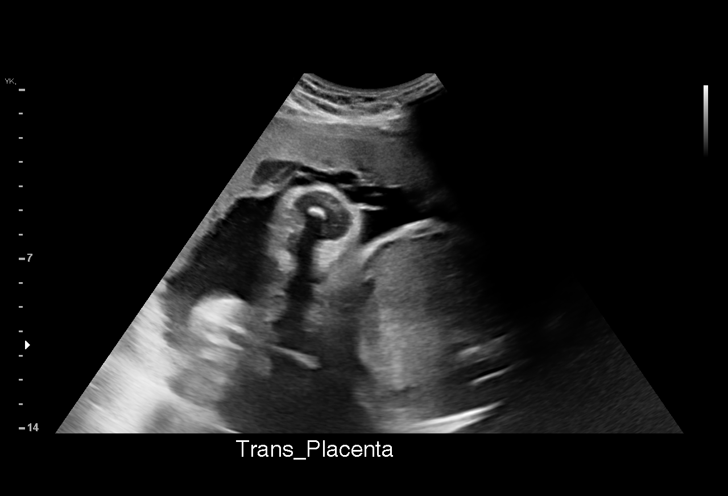
[im 14/38]
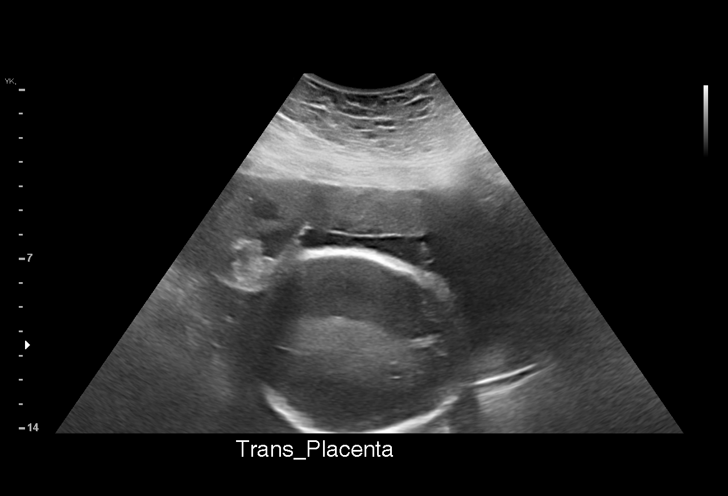
[im 17/38]
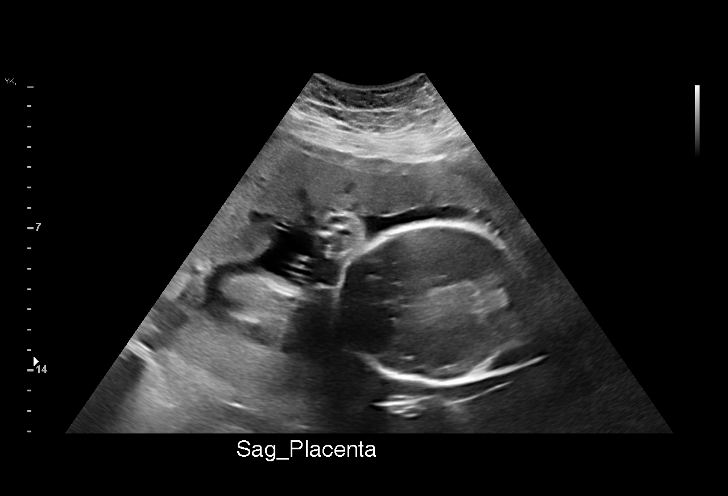
[im 20/38]
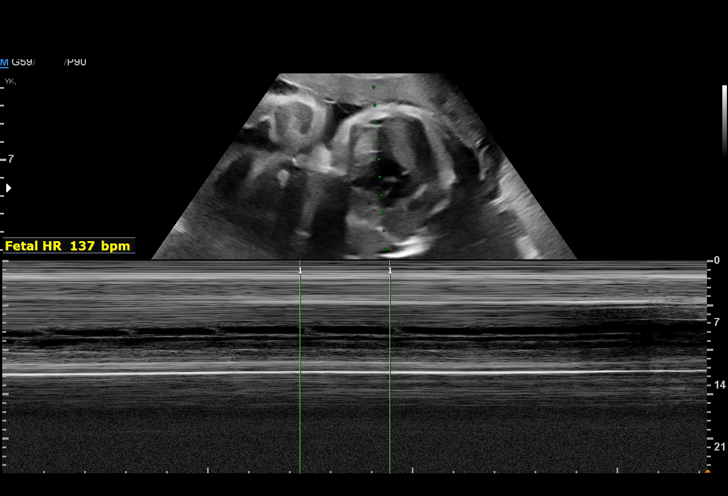
[im 21/38]
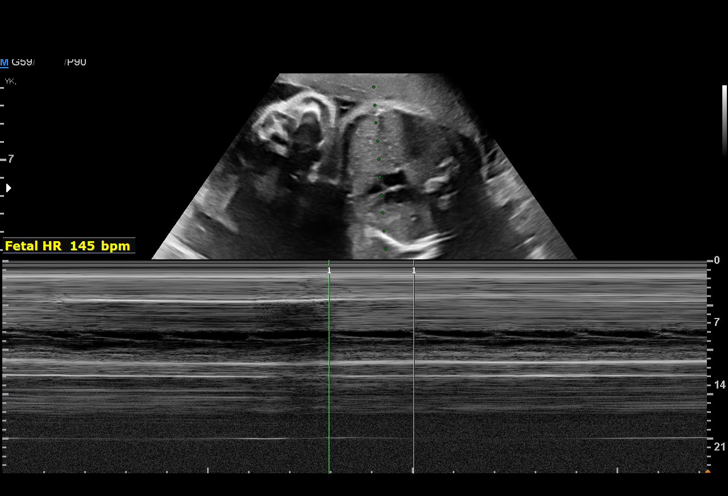
[im 24/38]
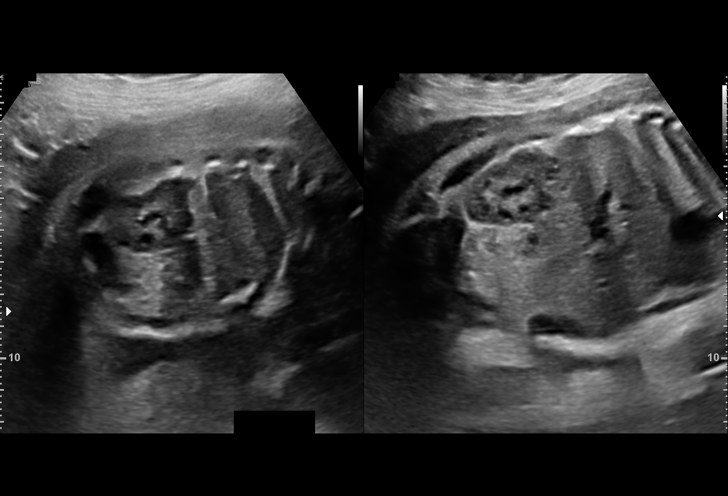
[im 27/38]
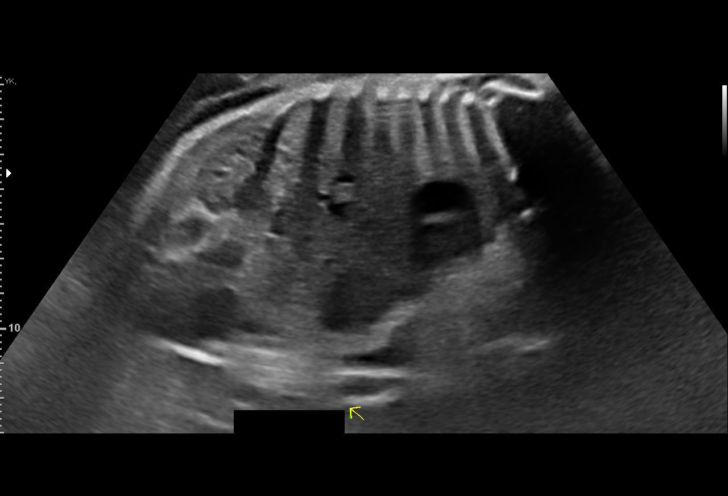
[im 29/38]
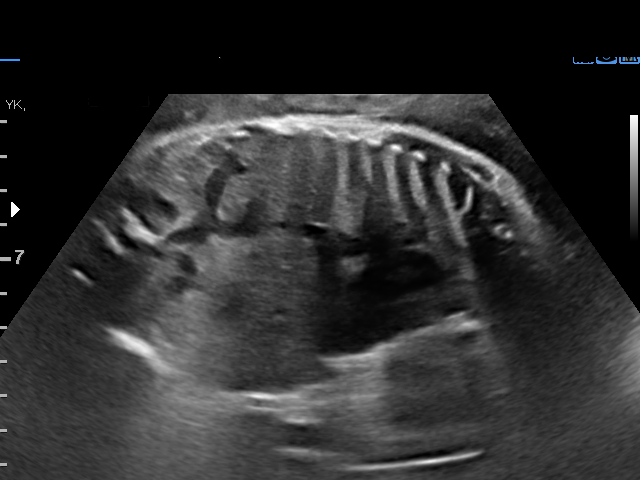
[im 32/38]
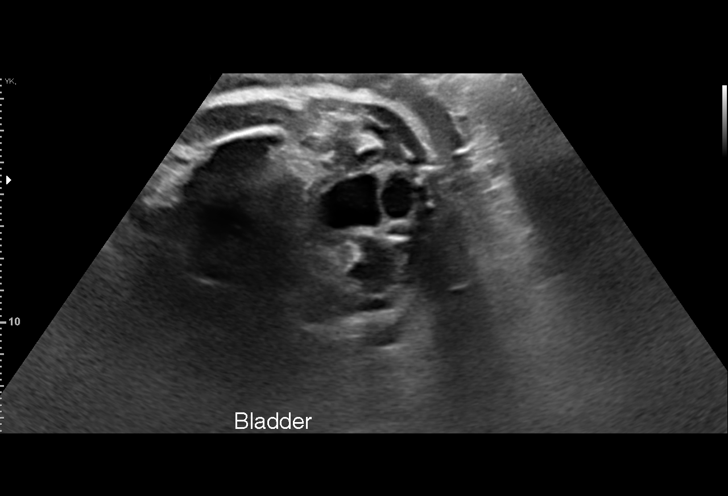
[im 35/38]
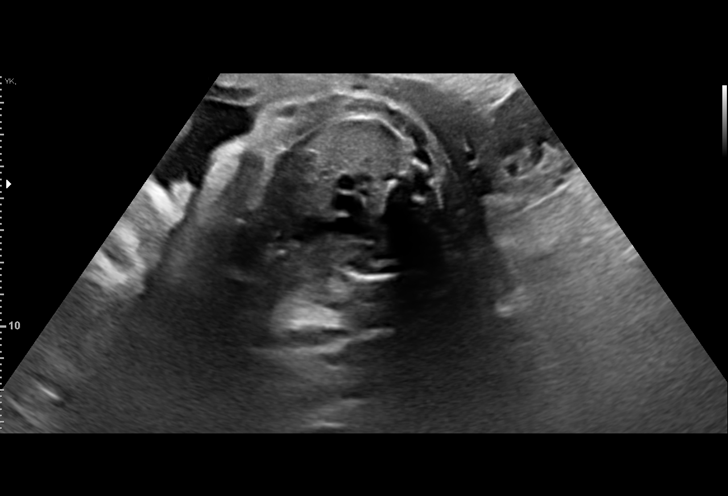
[im 38/38]
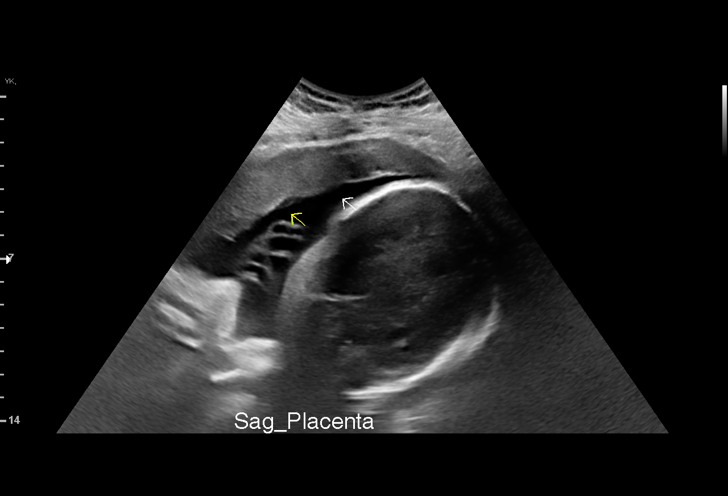

[15 of 28 positions shown; findings below may reference images not displayed]

CNM

 1   US MFM OB LIMITED                    76815.01     SAVIO LOCKLEAR

Indications

 Low lying placent (anterior),  antepartum
 Vaginal bleeding  in pregnancy, third trimester
 History of cesarean delivery, currently
 pregnant
 Poor obstetric history: Previous gestational
 diabetes
 Gestational diabetes (diet controlled)
 29 weeks gestation of pregnancy
Fetal Evaluation

 Num Of Fetuses:          1
 Fetal Heart Rate(bpm):   137
 Cardiac Activity:        Observed
 Presentation:            Cephalic
 Placenta:                Anterior
 P. Cord Insertion:       Visualized, central

 Amniotic Fluid
 AFI FV:      Within normal limits

                             Largest Pocket(cm)


 Comment:     No Low Lying placenta is seen.
OB History
 Gravidity:     3         Term:  2
 Living:        2
Gestational Age

 LMP:            29w 1d       Date:  03/05/20                   EDD:  12/10/20
 Best:           29w 1d    Det. By:  LMP  (03/05/20)            EDD:  12/10/20
Anatomy

 Diaphragm:              Appears normal         Cord Vessels:           Appears normal (3
                                                                        vessel cord)
 Stomach:                Appears normal, left   Kidneys:                Appear normal
                         sided
 Abdomen:                Appears normal         Bladder:                Appears normal
Cervix Uterus Adnexa

 Cervix
 Not visualized (advanced GA >04wks)
Impression

 Patient was evaluated for c/o vaginal bleeding (spotting).

 A limited ultrasound study was performed .Amniotic fluid is
 normal and good fetal activity is seen . Placenta is anterior and
 there is no evidence of previa or low-lying placenta.
 No evidence of retroplacental hemorrhage.

 She is being followed at the [HOSPITAL].
Recommendations

 -Transvaginal ultrasound if patient continues to have vaginal
 bleeding.
                 [place], [place]

## 2020-09-25 NOTE — ED Notes (Signed)
OB rapid response nurse contacted at this time.

## 2020-09-25 NOTE — MAU Provider Note (Signed)
History     CSN: [id number]  Arrival date and time: 09/25/20 1229   Event Date/Time   First Provider Initiated Contact with Patient 09/25/20 1641      Chief Complaint  Patient presents with  . Vaginal Bleeding   [name] is a 40 y.o. year old G42P2002 female at [redacted]w[redacted]d weeks gestation who presents to MAU via [place] from [facility] ED reporting she had vaginal spotting x 3 days. She was told by [facility] 07/19/2020 that she had a low-lying placenta (8 mm from cervical os). She advised not to have sex; she has not had SI since before then. Patient receives [facility] at [facility]. She missed her last appt on 08/18/2020 and has no other appointments. Her friend is present and contributing to the history taking.   OB History    Gravida  3   Para  2   Term  2   Preterm      AB      Living  2     SAB      IAB      Ectopic      Multiple  0   Live Births  2           Past Medical History:  Diagnosis Date  . Gestational diabetes    diet controlled  . Medical history non-contributory     Past Surgical History:  Procedure Laterality Date  . CESAREAN SECTION    . CESAREAN SECTION N/A 10/26/2016   Procedure: CESAREAN SECTION;  Surgeon: Chancy Milroy, MD;  Location: [place];  Service: Obstetrics;  Laterality: N/A;    Family History  Problem Relation Age of Onset  . Autism Daughter   . Alcohol abuse Neg Hx   . Arthritis Neg Hx   . Asthma Neg Hx   . Birth defects Neg Hx   . Cancer Neg Hx   . COPD Neg Hx   . Depression Neg Hx   . Diabetes Neg Hx   . Drug abuse Neg Hx   . Early death Neg Hx   . Hearing loss Neg Hx   . Hyperlipidemia Neg Hx   . Heart disease Neg Hx   . Hypertension Neg Hx   . Kidney disease Neg Hx   . Learning disabilities Neg Hx   . Mental illness Neg Hx   . Mental retardation Neg Hx   . Miscarriages / Stillbirths Neg Hx   . Stroke Neg Hx   . Vision loss Neg Hx   . Varicose Veins Neg Hx     Social History   Tobacco  Use  . Smoking status: Never Smoker  . Smokeless tobacco: Never Used  Vaping Use  . Vaping Use: Never used  Substance Use Topics  . Alcohol use: No  . Drug use: No    Allergies: No Known Allergies  Medications Prior to Admission  Medication Sig Dispense Refill Last Dose  . aspirin 81 MG EC tablet Take 1 tablet (81 mg total) by mouth daily. Swallow whole. 90 tablet 3   . Prenatal Vit-Fe Fumarate-FA (MULTIVITAMIN-PRENATAL) 27-0.8 MG TABS tablet Take 1 tablet by mouth daily.        Review of Systems  Constitutional: Negative.   HENT: Negative.   Eyes: Negative.   Respiratory: Negative.   Cardiovascular: Negative.   Gastrointestinal: Negative.   Endocrine: Negative.   Genitourinary: Positive for vaginal bleeding (spotting x 3 days).       Low-lying  placenta dx'd on 07/19/20  Musculoskeletal: Negative.   Skin: Negative.   Allergic/Immunologic: Negative.   Neurological: Negative.   Hematological: Negative.   Psychiatric/Behavioral: Negative.    Physical Exam   Blood pressure 127/77, pulse 81, temperature 98.2 F (36.8 C), temperature source Oral, resp. rate 18, height 5\' 5"  (1.651 m), weight 86.6 kg, last menstrual period 03/05/2020, SpO2 96 %.  Physical Exam Vitals and nursing note reviewed. Exam conducted with a chaperone present.  Constitutional:      Appearance: Normal appearance. She is obese.  Cardiovascular:     Rate and Rhythm: Normal rate.  Abdominal:     Palpations: Abdomen is soft.  Genitourinary:    General: Normal vulva.     Comments: Dilation: 1 Effacement (%): Thick Cervical Position: Posterior Station: Ballotable Presentation: Vertex Exam by: Sunday Corn, CNM  Neurological:     Mental Status: She is alert and oriented to person, place, and time.  Psychiatric:        Mood and Affect: Mood normal.        Behavior: Behavior normal.        Thought Content: Thought content normal.        Judgment: Judgment normal.    REACTIVE NST - FHR: 140 bpm /  moderate variability / accels present / decels absent / TOCO: Occ UC with UI noted MAU Course  Procedures  MDM OB MFM Limited U/S  *Consult with Dr. Rip Harbour @ 2013 - notified of patient's complaints, assessments, lab & U/S results, tx plan d/c home, F/U in office - ok to d/c home, agrees with plan  Assessment and Plan  Vaginal bleeding  Information provided on VB in 3rd trimester  - Return to MAU:  If you have heavier bleeding that soaks through more that 2 pads per hour for an hour or more  If you bleed so much that you feel like you might pass out or you do pass out  If you have significant abdominal pain that is not improved with Tylenol 1000 mg every 6 hours as needed for pain  If you develop a fever > 100.5   [redacted] weeks gestation of pregnancy   - Discharge patient - Call to schedule OB appt this week - Message sent to [place] - Patient verbalized an understanding of the plan of care and agrees.   Laury Deep, CNM 09/25/2020, 4:41 PM

## 2020-09-25 NOTE — ED Provider Notes (Signed)
[facility] EMERGENCY DEPARTMENT Provider Note   CSN: [id number] Arrival date & time: 09/25/20  1229     History Chief Complaint  Patient presents with  . Vaginal Bleeding    Kayla Walker is a 40 y.o. female G3P2, currently [redacted] weeks gestation presenting with a 3 day history of vaginal bleeding.  She describes (and has cell phone picture) of a dark brown mucus that she only sees when she wipes after urination.  She denies vaginal bleeding or discharge at other times, has not needs to wear a pad or liner.  She denies any complaints of abdominal pain, cramping, also no dizziness, n/v or other sx. Per US obtained at first prenatal visit in March, pt had a low lying placenta.   HPI     Past Medical History:  Diagnosis Date  . Gestational diabetes    diet controlled  . Medical history non-contributory     Patient Active Problem List   Diagnosis Date Noted  . History of gestational diabetes in prior pregnancy, currently pregnant 07/21/2020  . Encounter for supervision of normal pregnancy, antepartum 07/21/2020  . Low-lying placenta 07/21/2020  . Uterine fibroids 07/30/2016  . History of cesarean delivery 04/24/2016    Past Surgical History:  Procedure Laterality Date  . CESAREAN SECTION    . CESAREAN SECTION N/A 10/26/2016   Procedure: CESAREAN SECTION;  Surgeon: Chancy Milroy, MD;  Location: [place];  Service: Obstetrics;  Laterality: N/A;     OB History    Gravida  3   Para  2   Term  2   Preterm      AB      Living  2     SAB      IAB      Ectopic      Multiple  0   Live Births  2           Family History  Problem Relation Age of Onset  . Autism Daughter   . Alcohol abuse Neg Hx   . Arthritis Neg Hx   . Asthma Neg Hx   . Birth defects Neg Hx   . Cancer Neg Hx   . COPD Neg Hx   . Depression Neg Hx   . Diabetes Neg Hx   . Drug abuse Neg Hx   . Early death Neg Hx   . Hearing loss Neg Hx   . Hyperlipidemia Neg Hx   . Heart  disease Neg Hx   . Hypertension Neg Hx   . Kidney disease Neg Hx   . Learning disabilities Neg Hx   . Mental illness Neg Hx   . Mental retardation Neg Hx   . Miscarriages / Stillbirths Neg Hx   . Stroke Neg Hx   . Vision loss Neg Hx   . Varicose Veins Neg Hx     Social History   Tobacco Use  . Smoking status: Never Smoker  . Smokeless tobacco: Never Used  Vaping Use  . Vaping Use: Never used  Substance Use Topics  . Alcohol use: No  . Drug use: No    Home Medications Prior to Admission medications   Medication Sig Start Date End Date Taking? Authorizing Provider  aspirin 81 MG EC tablet Take 1 tablet (81 mg total) by mouth daily. Swallow whole. 07/21/20   Roma Schanz, CNM  Prenatal Vit-Fe Fumarate-FA (MULTIVITAMIN-PRENATAL) 27-0.8 MG TABS tablet Take 1 tablet by mouth daily.     [provider]    Allergies    Patient has no known allergies.  Review of Systems   Review of Systems  Constitutional: Negative for fever.  HENT: Negative for congestion and sore throat.   Eyes: Negative.   Respiratory: Negative for chest tightness and shortness of breath.   Cardiovascular: Negative for chest pain.  Gastrointestinal: Negative for abdominal pain and nausea.  Genitourinary: Positive for vaginal bleeding.  Musculoskeletal: Negative for arthralgias, joint swelling and neck pain.  Skin: Negative.  Negative for rash and wound.  Neurological: Negative for dizziness, weakness, light-headedness, numbness and headaches.  Psychiatric/Behavioral: Negative.   All other systems reviewed and are negative.   Physical Exam Updated Vital Signs BP 131/65 (BP Location: Right Arm)   Pulse 95   Temp 98 F (36.7 C) (Oral)   Resp 18   Ht 5\' 5"  (1.651 m)   Wt 86.6 kg   LMP 03/05/2020   SpO2 99%   BMI 31.77 kg/m   Physical Exam Vitals and nursing note reviewed.  Constitutional:      Appearance: She is well-developed.  HENT:     Head: Normocephalic and atraumatic.   Eyes:     Conjunctiva/sclera: Conjunctivae normal.  Cardiovascular:     Rate and Rhythm: Normal rate and regular rhythm.     Heart sounds: Normal heart sounds.  Pulmonary:     Effort: Pulmonary effort is normal.     Breath sounds: Normal breath sounds. No wheezing.  Abdominal:     General: Bowel sounds are normal.     Palpations: Abdomen is soft.     Tenderness: There is no abdominal tenderness. There is no guarding or rebound.     Comments: Gravid, c/w stated date.   Genitourinary:    Comments: deferred Musculoskeletal:        General: Normal range of motion.     Cervical back: Normal range of motion.  Skin:    General: Skin is warm and dry.  Neurological:     Mental Status: She is alert.     ED Results / Procedures / Treatments   Labs (all labs ordered are listed, but only abnormal results are displayed) Labs Reviewed - No data to display  EKG None  Radiology No results found.  Procedures Procedures   Medications Ordered in ED Medications - No data to display  ED Course  I have reviewed the triage vital signs and the nursing notes.  Pertinent labs & imaging results that were available during my care of the patient were reviewed by me and considered in my medical decision making (see chart for details).    MDM Rules/Calculators/A&P                          Pt evaluated by [place] through MAU.  Requests pt be transferred to MAU for observation.  Dr. Rip Harbour accepting physician. Final Clinical Impression(s) / ED Diagnoses Final diagnoses:  Vaginal bleeding  [redacted] weeks gestation of pregnancy    Rx / DC Orders ED Discharge Orders    None       Landis Martins 09/25/20 1331    Luna Fuse, MD 09/25/20 [phone]

## 2020-09-25 NOTE — ED Notes (Signed)
Spoke with [facility] to set up transportation at this time.

## 2020-09-25 NOTE — ED Triage Notes (Signed)
Pt is [redacted] wks pregnant, states she started having some vaginal bleeding about 3 days ago. States she came in to be checked since the bleeding has not stopped.

## 2020-09-25 NOTE — MAU Note (Signed)
Pt reports to mau via [facility] from [employer] with reports of vag spotting for the past 3 days.  Pt reports she only sees the bleeding when she wipes.  Pt denies any pain today.  Denies LOF.  Denies recent intercourse.

## 2020-09-25 NOTE — Progress Notes (Addendum)
Received call from [place] for a pt who is presenting with dark brown vaginal bleeding x 3 days at [redacted]w[redacted]d. Pt is G3P2 with 2 previous c/s. [facility] and has a low lying placenta according to prenatal records. Pt placed on monitor: FHR baseline 150, moderate variability, 15x15 accels, no contractions seen. Dr. Rip Harbour notified and recommends transfer to MAU for further evaulation.

## 2020-09-25 NOTE — Discharge Instructions (Signed)
Return to MAU:  If you have heavier bleeding that soaks through more that 2 pads per hour for an hour or more  If you bleed so much that you feel like you might pass out or you do pass out  If you have significant abdominal pain that is not improved with Tylenol 1000 mg every 6 hours as needed for pain  If you develop a fever > 100.5

## 2020-09-26 ENCOUNTER — Encounter: Payer: Self-pay | Admitting: Women's Health

## 2020-09-26 ENCOUNTER — Telehealth: Payer: Self-pay | Admitting: Obstetrics & Gynecology

## 2020-09-26 NOTE — Telephone Encounter (Signed)
Called a patient, because I got a message for MAU that patient needs a visit for a HROB, Called patient and left a message to return my call.

## 2020-10-05 ENCOUNTER — Other Ambulatory Visit: Payer: Self-pay

## 2020-10-05 ENCOUNTER — Ambulatory Visit (INDEPENDENT_AMBULATORY_CARE_PROVIDER_SITE_OTHER): Payer: Medicaid Other | Admitting: Advanced Practice Midwife

## 2020-10-05 VITALS — BP 110/67 | HR 89 | Wt 192.8 lb

## 2020-10-05 DIAGNOSIS — O0930 Supervision of pregnancy with insufficient antenatal care, unspecified trimester: Secondary | ICD-10-CM | POA: Insufficient documentation

## 2020-10-05 DIAGNOSIS — O0933 Supervision of pregnancy with insufficient antenatal care, third trimester: Secondary | ICD-10-CM

## 2020-10-05 DIAGNOSIS — O09529 Supervision of elderly multigravida, unspecified trimester: Secondary | ICD-10-CM | POA: Insufficient documentation

## 2020-10-05 DIAGNOSIS — O26843 Uterine size-date discrepancy, third trimester: Secondary | ICD-10-CM

## 2020-10-05 DIAGNOSIS — Z3480 Encounter for supervision of other normal pregnancy, unspecified trimester: Secondary | ICD-10-CM

## 2020-10-05 DIAGNOSIS — Z98891 History of uterine scar from previous surgery: Secondary | ICD-10-CM

## 2020-10-05 DIAGNOSIS — Z3A30 30 weeks gestation of pregnancy: Secondary | ICD-10-CM

## 2020-10-05 DIAGNOSIS — O09523 Supervision of elderly multigravida, third trimester: Secondary | ICD-10-CM

## 2020-10-05 NOTE — Progress Notes (Signed)
   LOW-RISK PREGNANCY VISIT Patient name: Kayla Walker MRN [id number]  Date of birth: 1980/09/17 Chief Complaint:   Routine Prenatal Visit  History of Present Illness:   Kayla Walker is a 40 y.o. G4P2002 female at [redacted]w[redacted]d with an Estimated Date of Delivery: 12/10/20 being seen today for ongoing management of a low-risk pregnancy.  Today she reports  doing well; missed appointments because she forgot to come; had MAU visit 6/5 for vag spotting and low-lying placenta had resolved ; having low back/low pelvic discomfort intermittently.  Contractions: Not present. Vag. Bleeding: None.  Movement: Present. denies leaking of fluid. Review of Systems:   Pertinent items are noted in HPI Denies abnormal vaginal discharge w/ itching/odor/irritation, headaches, visual changes, shortness of breath, chest pain, abdominal pain, severe nausea/vomiting, or problems with urination or bowel movements unless otherwise stated above. Pertinent History Reviewed:  Reviewed past medical,surgical, social, obstetrical and family history.  Reviewed problem list, medications and allergies. Physical Assessment:   Vitals:   10/05/20 0853  BP: 110/67  Pulse: 89  Weight: 192 lb 12.8 oz (87.5 kg)  Body mass index is 32.08 kg/m.        Physical Examination:   General appearance: Well appearing, and in no distress  Mental status: Alert, oriented to person, place, and time  Skin: Warm & dry  Cardiovascular: Normal heart rate noted  Respiratory: Normal respiratory effort, no distress  Abdomen: Soft, gravid, nontender  Pelvic: Cervical exam deferred         Extremities: Edema: Trace  Fetal Status: Fetal Heart Rate (bpm): 134 Fundal Height: 33 cm Movement: Present    No results found for this or any previous visit (from the past 24 hour(s)).  Assessment & Plan:  1) Low-risk pregnancy G3P2002 at [redacted]w[redacted]d with an Estimated Date of Delivery: 12/10/20 with nl discomforts> rec maternity support belt  2) Lapse in care 19-30wk,  forgot to keep appointments; needs ASAP PN2  3) Prev C/S x 2, wants TOLAC> issue reviewed including need for MD discussion, no induction, etc  4) AMA, not taking bASA, <40yo at time of delivery  5) Hx GDM, PN2 to be scheduled ASAP  6) Needs Pap, will get with GBS/cultures  7) Uterine S>D, growth scan with next visit   Meds: No orders of the defined types were placed in this encounter.  Labs/procedures today: none  Plan:  Continue routine obstetrical care   Reviewed: Preterm labor symptoms and general obstetric precautions including but not limited to vaginal bleeding, contractions, leaking of fluid and fetal movement were reviewed in detail with the patient.  All questions were answered. Didn't ask about home bp cuff. Check bp weekly, let us know if >140/90.   Follow-up: Return in about 2 weeks (around 10/19/2020) for [place] with MD; ASAP PN2, [place]: EFW.  Orders Placed This Encounter  Procedures   US OB Follow Up   Myrtis Ser [facility] 10/05/2020 9:45 AM

## 2020-10-05 NOTE — Patient Instructions (Signed)
Kayla Walker, thank you for choosing our office today! We appreciate the opportunity to meet your healthcare needs. You may receive a short survey by mail, e-mail, or through [employer]. If you are happy with your care we would appreciate if you could take just a few minutes to complete the survey questions. We read all of your comments and take your feedback very seriously. Thank you again for choosing our office.  Center for [employer] Team at [place] at [facility] ([place], [place] [address]) Entrance C, located off of [address] parking   CLASSES: Go to [email or web address] to register for classes (childbirth, breastfeeding, waterbirth, infant CPR, daddy bootcamp, etc.)  Call the office [phone]) or go to [facility] if: You begin to have strong, frequent contractions Your water breaks.  Sometimes it is a big gush of fluid, sometimes it is just a trickle that keeps getting your panties wet or running down your legs You have vaginal bleeding.  It is normal to have a small amount of spotting if your cervix was checked.  You don't feel your baby moving like normal.  If you don't, get you something to eat and drink and lay down and focus on feeling your baby move.   If your baby is still not moving like normal, you should call the office or go to [facility].  Call the office [phone]) or go to [facility] hospital for these signs of pre-eclampsia: Severe headache that does not go away with Tylenol Visual changes- seeing spots, double, blurred vision Pain under your right breast or upper abdomen that does not go away with Tums or heartburn medicine Nausea and/or vomiting Severe swelling in your hands, feet, and face   Tdap Vaccine It is recommended that you get the Tdap vaccine during the third trimester of EACH pregnancy to help protect your baby from getting pertussis (whooping cough) 27-36 weeks is the BEST time to do  this so that you can pass the protection on to your baby. During pregnancy is better than after pregnancy, but if you are unable to get it during pregnancy it will be offered at the hospital.  You can get this vaccine with [place], at the health department, your family doctor, or some local pharmacies Everyone who will be around your baby should also be up-to-date on their vaccines before the baby comes. Adults (who are not pregnant) only need 1 dose of Tdap during adulthood.   [facility] Pediatricians/Family Doctors [facility]): [address] [place], [facility]: [address] [place], [phone]                [facility]): [place], [phone] (call to ask if accepting patients) [facility] Department: [address], [place], [place] Pediatricians/Family Doctors [facility]): [place]. [place], [address], [place] [facility]: [address] [place], [place] [facility]: [place], [place] [facility]): [phone] [facility]: [address], [place]: [address], [place] [facility]: [phone], [phone]  Home Blood Pressure Monitoring for Patients   Your provider has recommended that you check your  blood pressure (BP) at least once a week at home. If you do not have a blood pressure cuff at home, one will be provided for you. Contact your provider if you have not received your monitor within 1 week.   Helpful Tips for Accurate Home Blood Pressure Checks  Don't smoke, exercise, or drink caffeine 30 minutes before checking your BP Use the restroom before checking your BP (a full bladder can raise your  pressure) Relax in a comfortable upright chair Feet on the ground Left arm resting comfortably on a flat surface at the level of your heart Legs uncrossed Back supported Sit quietly and don't talk Place the cuff on your bare arm Adjust snuggly, so that only two fingertips can fit between your skin and the top of the cuff Check 2 readings separated by at least one minute Keep a log of your BP readings For a visual, please reference this diagram: http://ccnc.care/bpdiagram  Provider Name: [facility]     Phone: [phone]  Zone 1: ALL CLEAR  Continue to monitor your symptoms:  BP reading is less than 140 (top number) or less than 90 (bottom number)  No right upper stomach pain No headaches or seeing spots No feeling nauseated or throwing up No swelling in face and hands  Zone 2: CAUTION Call your doctor's office for any of the following:  BP reading is greater than 140 (top number) or greater than 90 (bottom number)  Stomach pain under your ribs in the middle or right side Headaches or seeing spots Feeling nauseated or throwing up Swelling in face and hands  Zone 3: EMERGENCY  Seek immediate medical care if you have any of the following:  BP reading is greater than160 (top number) or greater than 110 (bottom number) Severe headaches not improving with Tylenol Serious difficulty catching your breath Any worsening symptoms from Zone 2   Third Trimester of Pregnancy The third trimester is from week 29 through week 42, months 7 through 9. The third trimester is a time when the fetus is growing rapidly. At the end of the ninth month, the fetus is about 20 inches in length and weighs 6-10 pounds.  BODY CHANGES Your body goes through many changes during pregnancy. The changes vary from woman to woman.  Your weight will continue to increase. You can expect to gain 25-35 pounds (11-16 kg) by the end of the pregnancy. You may begin to get stretch marks on your hips, abdomen,  and breasts. You may urinate more often because the fetus is moving lower into your pelvis and pressing on your bladder. You may develop or continue to have heartburn as a result of your pregnancy. You may develop constipation because certain hormones are causing the muscles that push waste through your intestines to slow down. You may develop hemorrhoids or swollen, bulging veins (varicose veins). You may have pelvic pain because of the weight gain and pregnancy hormones relaxing your joints between the bones in your pelvis. Backaches may result from overexertion of the muscles supporting your posture. You may have changes in your hair. These can include thickening of your hair, rapid growth, and changes in texture. Some women also have hair loss during or after pregnancy, or hair that feels dry or thin. Your hair will most likely return to normal after your baby is born. Your breasts will continue to grow and be tender. A yellow discharge may leak from your breasts called colostrum. Your belly button may stick out. You may   feel short of breath because of your expanding uterus. You may notice the fetus "dropping," or moving lower in your abdomen. You may have a bloody mucus discharge. This usually occurs a few days to a week before labor begins. Your cervix becomes thin and soft (effaced) near your due date. WHAT TO EXPECT AT YOUR PRENATAL EXAMS  You will have prenatal exams every 2 weeks until week 36. Then, you will have weekly prenatal exams. During a routine prenatal visit: You will be weighed to make sure you and the fetus are growing normally. Your blood pressure is taken. Your abdomen will be measured to track your baby's growth. The fetal heartbeat will be listened to. Any test results from the previous visit will be discussed. You may have a cervical check near your due date to see if you have effaced. At around 36 weeks, your caregiver will check your cervix. At the same time, your  caregiver will also perform a test on the secretions of the vaginal tissue. This test is to determine if a type of bacteria, Group B streptococcus, is present. Your caregiver will explain this further. Your caregiver may ask you: What your birth plan is. How you are feeling. If you are feeling the baby move. If you have had any abnormal symptoms, such as leaking fluid, bleeding, severe headaches, or abdominal cramping. If you have any questions. Other tests or screenings that may be performed during your third trimester include: Blood tests that check for low iron levels (anemia). Fetal testing to check the health, activity level, and growth of the fetus. Testing is done if you have certain medical conditions or if there are problems during the pregnancy. FALSE LABOR You may feel small, irregular contractions that eventually go away. These are called Braxton Hicks contractions, or false labor. Contractions may last for hours, days, or even weeks before true labor sets in. If contractions come at regular intervals, intensify, or become painful, it is best to be seen by your caregiver.  SIGNS OF LABOR  Menstrual-like cramps. Contractions that are 5 minutes apart or less. Contractions that start on the top of the uterus and spread down to the lower abdomen and back. A sense of increased pelvic pressure or back pain. A watery or bloody mucus discharge that comes from the vagina. If you have any of these signs before the 37th week of pregnancy, call your caregiver right away. You need to go to the hospital to get checked immediately. HOME CARE INSTRUCTIONS  Avoid all smoking, herbs, alcohol, and unprescribed drugs. These chemicals affect the formation and growth of the baby. Follow your caregiver's instructions regarding medicine use. There are medicines that are either safe or unsafe to take during pregnancy. Exercise only as directed by your caregiver. Experiencing uterine cramps is a good sign to  stop exercising. Continue to eat regular, healthy meals. Wear a good support bra for breast tenderness. Do not use hot tubs, steam rooms, or saunas. Wear your seat belt at all times when driving. Avoid raw meat, uncooked cheese, cat litter boxes, and soil used by cats. These carry germs that can cause birth defects in the baby. Take your prenatal vitamins. Try taking a stool softener (if your caregiver approves) if you develop constipation. Eat more high-fiber foods, such as fresh vegetables or fruit and whole grains. Drink plenty of fluids to keep your urine clear or pale yellow. Take warm sitz baths to soothe any pain or discomfort caused by hemorrhoids. Use hemorrhoid cream if   your caregiver approves. If you develop varicose veins, wear support hose. Elevate your feet for 15 minutes, 3-4 times a day. Limit salt in your diet. Avoid heavy lifting, wear low heal shoes, and practice good posture. Rest a lot with your legs elevated if you have leg cramps or low back pain. Visit your dentist if you have not gone during your pregnancy. Use a soft toothbrush to brush your teeth and be gentle when you floss. A sexual relationship may be continued unless your caregiver directs you otherwise. Do not travel far distances unless it is absolutely necessary and only with the approval of your caregiver. Take prenatal classes to understand, practice, and ask questions about the labor and delivery. Make a trial run to the hospital. Pack your hospital bag. Prepare the baby's nursery. Continue to go to all your prenatal visits as directed by your caregiver. SEEK MEDICAL CARE IF: You are unsure if you are in labor or if your water has broken. You have dizziness. You have mild pelvic cramps, pelvic pressure, or nagging pain in your abdominal area. You have persistent nausea, vomiting, or diarrhea. You have a bad smelling vaginal discharge. You have pain with urination. SEEK IMMEDIATE MEDICAL CARE IF:  You  have a fever. You are leaking fluid from your vagina. You have spotting or bleeding from your vagina. You have severe abdominal cramping or pain. You have rapid weight loss or gain. You have shortness of breath with chest pain. You notice sudden or extreme swelling of your face, hands, ankles, feet, or legs. You have not felt your baby move in over an hour. You have severe headaches that do not go away with medicine. You have vision changes. Document Released: 04/03/2001 Document Revised: 04/14/2013 Document Reviewed: 06/10/2012 [facility] Patient Information 2015 [place], [place]. This information is not intended to replace advice given to you by your health care provider. Make sure you discuss any questions you have with your health care provider.

## 2020-10-07 ENCOUNTER — Other Ambulatory Visit: Payer: Self-pay

## 2020-10-07 ENCOUNTER — Other Ambulatory Visit: Payer: Medicaid Other

## 2020-10-07 ENCOUNTER — Ambulatory Visit (INDEPENDENT_AMBULATORY_CARE_PROVIDER_SITE_OTHER): Payer: Medicaid Other

## 2020-10-07 DIAGNOSIS — Z3A30 30 weeks gestation of pregnancy: Secondary | ICD-10-CM

## 2020-10-07 DIAGNOSIS — Z3480 Encounter for supervision of other normal pregnancy, unspecified trimester: Secondary | ICD-10-CM

## 2020-10-07 DIAGNOSIS — O09523 Supervision of elderly multigravida, third trimester: Secondary | ICD-10-CM

## 2020-10-07 DIAGNOSIS — O26843 Uterine size-date discrepancy, third trimester: Secondary | ICD-10-CM | POA: Diagnosis not present

## 2020-10-07 NOTE — Progress Notes (Signed)
[place] 67+0 wks,cephalic,anterior placenta gr 0,AFI 15.9 cm,fhr 137 bpm,normal ovaries,EFW 2007 g 89%

## 2020-10-10 ENCOUNTER — Other Ambulatory Visit: Payer: Self-pay | Admitting: *Deleted

## 2020-10-10 ENCOUNTER — Encounter: Payer: Self-pay | Admitting: Women's Health

## 2020-10-10 DIAGNOSIS — O24419 Gestational diabetes mellitus in pregnancy, unspecified control: Secondary | ICD-10-CM

## 2020-10-10 MED ORDER — ACCU-CHEK GUIDE VI STRP: ORAL_STRIP | 12 refills | Status: DC

## 2020-10-10 MED ORDER — ACCU-CHEK SOFTCLIX LANCETS MISC: 12 refills | Status: DC

## 2020-10-10 MED ORDER — ACCU-CHEK GUIDE ME W/DEVICE KIT: 1.0000 | PACK | Freq: Four times a day (QID) | 0 refills | Status: DC

## 2020-10-11 LAB — CBC
Hematocrit: 36 % (ref 34.0–46.6)
Hemoglobin: 11.9 g/dL (ref 11.1–15.9)
MCH: 27.5 pg (ref 26.6–33.0)
MCHC: 33.1 g/dL (ref 31.5–35.7)
MCV: 83 fL (ref 79–97)
Platelets: 255 10*3/uL (ref 150–450)
RBC: 4.33 x10E6/uL (ref 3.77–5.28)
RDW: 14.7 % (ref 11.7–15.4)
WBC: 7.4 10*3/uL (ref 3.4–10.8)

## 2020-10-11 LAB — GLUCOSE TOLERANCE, 2 HOURS W/ 1HR
Glucose, 1 hour: 207 mg/dL — ABNORMAL HIGH (ref 65–179)
Glucose, 2 hour: 203 mg/dL — ABNORMAL HIGH (ref 65–152)
Glucose, Fasting: 94 mg/dL — ABNORMAL HIGH (ref 65–91)

## 2020-10-11 LAB — RPR, QUANT+TP ABS (REFLEX)
Rapid Plasma Reagin, Quant: 1:1 {titer} — ABNORMAL HIGH
T Pallidum Abs: NONREACTIVE

## 2020-10-11 LAB — HIV ANTIBODY (ROUTINE TESTING W REFLEX): HIV Screen 4th Generation wRfx: NONREACTIVE

## 2020-10-11 LAB — RPR: RPR Ser Ql: REACTIVE — AB

## 2020-10-11 LAB — ANTIBODY SCREEN: Antibody Screen: NEGATIVE

## 2020-10-18 ENCOUNTER — Other Ambulatory Visit: Payer: Self-pay

## 2020-10-18 ENCOUNTER — Encounter: Payer: Self-pay | Admitting: Obstetrics & Gynecology

## 2020-10-18 ENCOUNTER — Ambulatory Visit (INDEPENDENT_AMBULATORY_CARE_PROVIDER_SITE_OTHER): Payer: Medicaid Other | Admitting: Obstetrics & Gynecology

## 2020-10-18 VITALS — BP 124/69 | HR 81 | Wt 192.5 lb

## 2020-10-18 DIAGNOSIS — Z3A32 32 weeks gestation of pregnancy: Secondary | ICD-10-CM

## 2020-10-18 DIAGNOSIS — O0990 Supervision of high risk pregnancy, unspecified, unspecified trimester: Secondary | ICD-10-CM

## 2020-10-18 LAB — POCT URINALYSIS DIPSTICK OB
Blood, UA: NEGATIVE
Ketones, UA: NEGATIVE
Nitrite, UA: NEGATIVE
POC,PROTEIN,UA: NEGATIVE

## 2020-10-18 NOTE — Progress Notes (Signed)
HIGH-RISK PREGNANCY VISIT Patient name: Kayla Walker MRN [id number]  Date of birth: [birth year] Chief Complaint:   High Risk Gestation  History of Present Illness:   Kayla Walker is a 40 y.o. G31P2002 female at [redacted]w[redacted]d with an Estimated Date of Delivery: 12/10/20 being seen today for ongoing management of a high-risk pregnancy complicated by Class M7(EHMCNOB will be A2DM).    Today she reports no complaints. Contractions: Not present. Vag. Bleeding: None.  Movement: Present. denies leaking of fluid.   Depression screen [facility] 2/9 07/21/2020 06/09/2020 09/13/2016 04/24/2016  Decreased Interest 1 0 0 0  Down, Depressed, Hopeless 0 0 0 0  PHQ - 2 Score 1 0 0 0  Altered sleeping 1 1 - 0  Tired, decreased energy 1 0 - 0  Change in appetite 0 1 - 1  Feeling bad or failure about yourself  0 0 - 0  Trouble concentrating 0 0 - 0  Moving slowly or fidgety/restless 0 0 - 0  Suicidal thoughts 0 0 - 0  PHQ-9 Score 3 2 - 1     GAD 7 : Generalized Anxiety Score 07/21/2020 06/09/2020  Nervous, Anxious, on Edge 0 0  Control/stop worrying 0 0  Worry too much - different things 0 0  Trouble relaxing 0 0  Restless 0 0  Easily annoyed or irritable 1 1  Afraid - awful might happen 0 0  Total GAD 7 Score 1 1     Review of Systems:   Pertinent items are noted in HPI Denies abnormal vaginal discharge w/ itching/odor/irritation, headaches, visual changes, shortness of breath, chest pain, abdominal pain, severe nausea/vomiting, or problems with urination or bowel movements unless otherwise stated above. Pertinent History Reviewed:  Reviewed past medical,surgical, social, obstetrical and family history.  Reviewed problem list, medications and allergies. Physical Assessment:   Vitals:   10/18/20 0926  BP: 124/69  Pulse: 81  Weight: 192 lb 8 oz (87.3 kg)  Body mass index is 32.03 kg/m.           Physical Examination:   General appearance: alert, well appearing, and in no distress  Mental status: alert,  oriented to person, place, and time  Skin: warm & dry   Extremities: Edema: Trace    Cardiovascular: normal heart rate noted  Respiratory: normal respiratory effort, no distress  Abdomen: gravid, soft, non-tender  Pelvic: Cervical exam deferred         Fetal Status:     Movement: Present    Fetal Surveillance Testing today: FHR 159   Chaperone: N/A    No results found for this or any previous visit (from the past 24 hour(s)).  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   Labs/procedures today: none  Treatment Plan:  shorter follow up to check on her CBG, I suspect she will need meds  Reviewed: Preterm labor symptoms and general obstetric precautions including but not limited to vaginal bleeding, contractions, leaking of fluid and fetal movement were reviewed in detail with the patient.  All questions  were answered. Does not have home bp cuff. Office bp cuff given: not applicable. Check bp , let us know if consistently .  Follow-up: No follow-ups on file.   Future Appointments  Date Time Provider [place]  10/20/2020  8:15 AM [facility]  10/20/2020  1:15 PM [facility]    Orders Placed This Encounter  Procedures   POC Urinalysis [facility] OB   Florian Buff  10/18/2020 9:44 AM

## 2020-10-20 ENCOUNTER — Other Ambulatory Visit: Payer: Medicaid Other

## 2020-10-20 ENCOUNTER — Encounter: Payer: Medicaid Other | Attending: Advanced Practice Midwife | Admitting: Registered"

## 2020-10-20 ENCOUNTER — Other Ambulatory Visit: Payer: Self-pay

## 2020-10-20 ENCOUNTER — Ambulatory Visit: Payer: Medicaid Other | Admitting: Registered"

## 2020-10-20 DIAGNOSIS — O24419 Gestational diabetes mellitus in pregnancy, unspecified control: Secondary | ICD-10-CM | POA: Diagnosis not present

## 2020-10-20 DIAGNOSIS — O24410 Gestational diabetes mellitus in pregnancy, diet controlled: Secondary | ICD-10-CM

## 2020-10-20 NOTE — Progress Notes (Signed)
Patient was seen on 10/20/20 for Gestational Diabetes self-management. Estimated due date: 12/10/20; [redacted]w[redacted]d  Clinical: Medications: prenatal vitamin, aspirin Medical History: GDM with last pregnancy, cesarean delivery Labs: OGTT all 3 elevated, A1c 6.0% 07/21/20  Dietary and Lifestyle History: Pt states she avoids carbs and sugar. Per dietary recall pt may not be recommended protein. Pt states she does not eat meat daily and does not like beans.   Physical Activity: not assessed Stress: not assessed Sleep: ~8 hrs  24 hr Recall: First Meal: green juice: spinach, 1/2 apple, celery, avocado, 1/2 c milk or plain yogurt, water Snack: Second meal:vegetables, injera (teff pancake-like bread), 2 eggs Snack: Third meal: carrots, a few nets, green beans, injera Snack: Beverages: water, coffee w/ 1/2 c skim milk  NUTRITION INTERVENTION  Nutrition education (E-1) on the following topics:   Initial Follow-up  [x]  []  Definition of Gestational Diabetes [x]  []  Why dietary management is important in controlling blood glucose []  []  Effects each nutrient has on blood glucose levels []  []  Simple carbohydrates vs complex carbohydrates []  []  Fluid intake [x]  []  Creating a balanced meal plan [x]  []  Carbohydrate counting  [x]  []  When to check blood glucose levels [x]  []  Proper blood glucose monitoring techniques []  []  Effect of stress and stress reduction techniques  [x]  []  Exercise effect on blood glucose levels, appropriate exercise during pregnancy []  []  Importance of limiting caffeine and abstaining from alcohol and smoking []  []  Medications used for blood sugar control during pregnancy []  []  Hypoglycemia and rule of 15 [x]  []  Postpartum self care  Patient already has a meter, is testing pre breakfast and 2 hours after each meal. Pt did not bring log sheet, self reported readings FBS: 99 mg/dL Postprandial: 105, 118, 165 was highest after eating cookie  Patient instructed to monitor glucose  levels: FBS: 60 - ? 95 mg/dL (some clinics use 90 for cutoff) 1 hour: ? 140 mg/dL 2 hour: ? 120 mg/dL  Patient received handouts: Nutrition Diabetes and Pregnancy Carbohydrate Counting List  Patient will be seen for follow-up ~1 week to review blood sugar log and continue education.

## 2020-10-27 ENCOUNTER — Ambulatory Visit: Payer: Medicaid Other | Admitting: Registered"

## 2020-10-27 ENCOUNTER — Encounter: Payer: Medicaid Other | Attending: Advanced Practice Midwife | Admitting: Registered"

## 2020-10-27 ENCOUNTER — Other Ambulatory Visit: Payer: Self-pay

## 2020-10-27 DIAGNOSIS — O24410 Gestational diabetes mellitus in pregnancy, diet controlled: Secondary | ICD-10-CM

## 2020-10-27 DIAGNOSIS — O24419 Gestational diabetes mellitus in pregnancy, unspecified control: Secondary | ICD-10-CM | POA: Diagnosis not present

## 2020-10-27 NOTE — Progress Notes (Signed)
Patient was seen on 10/27/20 for Gestational Diabetes self-management. Estimated due date: 12/10/20; [redacted]w[redacted]d  Clinical: Medications: prenatal vitamin, aspirin Medical History: GDM with last pregnancy, cesarean delivery Labs: OGTT all 3 elevated, A1c 6.0% 07/21/20  Dietary and Lifestyle History: Pt states she still avoids carbs and sugar. Pt states she is adding more protein to her meals with chicken, beef, lentils, Greek yogurt.  Hungry at night be afraid to eat after 7:30. Ate last a little [place] Yogurt and BG was higher (95 mg/dl)    Physical Activity: house chores Stress:  Sleep: ~8 hrs, wakes up when husband gets home during the night.  24 hr Recall: First Meal: 1 egg, 1/2 c skim milk in coffee, cake (~30 g cho) Snack: Second meal:chicken, salad Snack: Third meal: injera (teff pancake-like bread). Lentils, collard green.  Snack: [place] yogurt Beverages: not sure how much but feels like enough water, coffee w/ 1/2 c skim milk  NUTRITION INTERVENTION  Nutrition education (E-1) on the following topics:   Initial Follow-up  [x]  []  Definition of Gestational Diabetes [x]  []  Why dietary management is important in controlling blood glucose []  [x]  Effects each nutrient has on blood glucose levels []  [x]  Simple carbohydrates vs complex carbohydrates []  [x]  Fluid intake [x]  []  Creating a balanced meal plan [x]  []  Carbohydrate counting  [x]  []  When to check blood glucose levels [x]  []  Proper blood glucose monitoring techniques []  [x]  Effect of stress and stress reduction techniques  [x]  []  Exercise effect on blood glucose levels, appropriate exercise during pregnancy []  [x]  Importance of limiting caffeine and abstaining from alcohol and smoking []  [x]  Medications used for blood sugar control during pregnancy []  [x]  Hypoglycemia and rule of 15 [x]  []  Postpartum self care  Also discussed A1c at follow-up visit  Patient instructed to monitor glucose levels: FBS: 60 - ? 95 mg/dL  (some clinics use 90 for cutoff) 1 hour: ? 140 mg/dL 2 hour: ? 120 mg/dL  Patient received handouts: A1c chart  Patient will be seen for follow-up ~1 week to review blood sugar log and continue education.

## 2020-10-28 ENCOUNTER — Ambulatory Visit (INDEPENDENT_AMBULATORY_CARE_PROVIDER_SITE_OTHER): Payer: Medicaid Other | Admitting: Obstetrics & Gynecology

## 2020-10-28 ENCOUNTER — Encounter: Payer: Self-pay | Admitting: Obstetrics & Gynecology

## 2020-10-28 VITALS — BP 117/69 | HR 84 | Wt 190.0 lb

## 2020-10-28 DIAGNOSIS — Z1389 Encounter for screening for other disorder: Secondary | ICD-10-CM | POA: Diagnosis not present

## 2020-10-28 DIAGNOSIS — O0993 Supervision of high risk pregnancy, unspecified, third trimester: Secondary | ICD-10-CM

## 2020-10-28 DIAGNOSIS — Z3A33 33 weeks gestation of pregnancy: Secondary | ICD-10-CM | POA: Diagnosis not present

## 2020-10-28 LAB — POCT URINALYSIS DIPSTICK OB
Blood, UA: NEGATIVE
Glucose, UA: NEGATIVE
Ketones, UA: NEGATIVE
Leukocytes, UA: NEGATIVE
Nitrite, UA: NEGATIVE

## 2020-10-28 NOTE — Progress Notes (Signed)
HIGH-RISK PREGNANCY VISIT Patient name: Nadra Hritz MRN [id number]  Date of birth: 07-05-1980 Chief Complaint:   Routine Prenatal Visit  History of Present Illness:   Bev Drennen is a 40 y.o. G51P2002 female at [redacted]w[redacted]d with an Estimated Date of Delivery: 12/10/20 being seen today for ongoing management of a high-risk pregnancy complicated by advanced maternal age and diabetes mellitus A1DM.    Today she reports no complaints. Contractions: Not present. Vag. Bleeding: None.  Movement: Present. denies leaking of fluid.   Depression screen [facility] 2/9 07/21/2020 06/09/2020 09/13/2016 04/24/2016  Decreased Interest 1 0 0 0  Down, Depressed, Hopeless 0 0 0 0  PHQ - 2 Score 1 0 0 0  Altered sleeping 1 1 - 0  Tired, decreased energy 1 0 - 0  Change in appetite 0 1 - 1  Feeling bad or failure about yourself  0 0 - 0  Trouble concentrating 0 0 - 0  Moving slowly or fidgety/restless 0 0 - 0  Suicidal thoughts 0 0 - 0  PHQ-9 Score 3 2 - 1     GAD 7 : Generalized Anxiety Score 07/21/2020 06/09/2020  Nervous, Anxious, on Edge 0 0  Control/stop worrying 0 0  Worry too much - different things 0 0  Trouble relaxing 0 0  Restless 0 0  Easily annoyed or irritable 1 1  Afraid - awful might happen 0 0  Total GAD 7 Score 1 1     Review of Systems:   Pertinent items are noted in HPI Denies abnormal vaginal discharge w/ itching/odor/irritation, headaches, visual changes, shortness of breath, chest pain, abdominal pain, severe nausea/vomiting, or problems with urination or bowel movements unless otherwise stated above. Pertinent History Reviewed:  Reviewed past medical,surgical, social, obstetrical and family history.  Reviewed problem list, medications and allergies. Physical Assessment:   Vitals:   10/28/20 0938  BP: 117/69  Pulse: 84  Weight: 190 lb (86.2 kg)  Body mass index is 31.62 kg/m.           Physical Examination:   General appearance: alert, well appearing, and in no distress  Mental  status: alert, oriented to person, place, and time  Skin: warm & dry   Extremities: Edema: Trace    Cardiovascular: normal heart rate noted  Respiratory: normal respiratory effort, no distress  Abdomen: gravid, soft, non-tender  Pelvic: Cervical exam deferred         Fetal Status:     Movement: Present    Fetal Surveillance Testing today: FHR 144   Chaperone: N/A    Results for orders placed or performed in visit on 10/28/20 (from the past 24 hour(s))  POC Urinalysis Dipstick OB   Collection Time: 10/28/20  9:41 AM  Result Value Ref Range   Color, UA     Clarity, UA     Glucose, UA Negative Negative   Bilirubin, UA     Ketones, UA neg    Spec Grav, UA     Blood, UA neg    pH, UA     POC,PROTEIN,UA Trace Negative, Trace, Small (1+), Moderate (2+), Large (3+), 4+   Urobilinogen, UA     Nitrite, UA neg    Leukocytes, UA Negative Negative   Appearance     Odor      Assessment & Plan:  High-risk pregnancy: [id number] at [redacted]w[redacted]d with an Estimated Date of Delivery: 12/10/20   1) Previous C section x 2, wants TOL, depends on the 36 weeks sonogram EFW,  2) A1DM, CBG surprisingly are better than I anticipated, no meds needed at this point her fastings are a bit borderline, we will see how it progresses with the pregnancy, stable  Meds: No orders of the defined types were placed in this encounter.   Labs/procedures today: none  Treatment Plan:  routine care  Reviewed: Preterm labor symptoms and general obstetric precautions including but not limited to vaginal bleeding, contractions, leaking of fluid and fetal movement were reviewed in detail with the patient.  All questions were answered. Does have home bp cuff. Office bp cuff given: not applicable. Check bp weekly, let us know if consistently >140 and/or >90.  Follow-up: No follow-ups on file.   Future Appointments  Date Time Provider [place]  11/03/2020  8:15 AM [facility]    Orders Placed This  Encounter  Procedures   POC Urinalysis Dipstick OB   Florian Buff  10/28/2020 9:55 AM

## 2020-11-03 ENCOUNTER — Encounter: Payer: Medicaid Other | Admitting: Registered"

## 2020-11-03 ENCOUNTER — Other Ambulatory Visit: Payer: Self-pay

## 2020-11-03 ENCOUNTER — Inpatient Hospital Stay (EMERGENCY_DEPARTMENT_HOSPITAL)
Admission: AD | Admit: 2020-11-03 | Discharge: 2020-11-04 | Disposition: A | Discharge status: Home / Self Care | Payer: Medicaid Other | Source: Home / Self Care | Attending: Emergency Medicine | Admitting: Emergency Medicine

## 2020-11-03 ENCOUNTER — Ambulatory Visit: Payer: Medicaid Other | Admitting: Registered"

## 2020-11-03 DIAGNOSIS — O24419 Gestational diabetes mellitus in pregnancy, unspecified control: Secondary | ICD-10-CM | POA: Diagnosis not present

## 2020-11-03 DIAGNOSIS — Z3A34 34 weeks gestation of pregnancy: Secondary | ICD-10-CM | POA: Insufficient documentation

## 2020-11-03 DIAGNOSIS — Z7982 Long term (current) use of aspirin: Secondary | ICD-10-CM | POA: Insufficient documentation

## 2020-11-03 DIAGNOSIS — O24410 Gestational diabetes mellitus in pregnancy, diet controlled: Secondary | ICD-10-CM

## 2020-11-03 DIAGNOSIS — Z20822 Contact with and (suspected) exposure to covid-19: Secondary | ICD-10-CM | POA: Insufficient documentation

## 2020-11-03 DIAGNOSIS — O4700 False labor before 37 completed weeks of gestation, unspecified trimester: Secondary | ICD-10-CM

## 2020-11-03 DIAGNOSIS — O4703 False labor before 37 completed weeks of gestation, third trimester: Secondary | ICD-10-CM | POA: Insufficient documentation

## 2020-11-03 DIAGNOSIS — O09523 Supervision of elderly multigravida, third trimester: Secondary | ICD-10-CM

## 2020-11-03 DIAGNOSIS — R109 Unspecified abdominal pain: Secondary | ICD-10-CM

## 2020-11-03 DIAGNOSIS — O26893 Other specified pregnancy related conditions, third trimester: Secondary | ICD-10-CM | POA: Insufficient documentation

## 2020-11-03 DIAGNOSIS — O0990 Supervision of high risk pregnancy, unspecified, unspecified trimester: Secondary | ICD-10-CM

## 2020-11-03 DIAGNOSIS — R609 Edema, unspecified: Secondary | ICD-10-CM

## 2020-11-03 DIAGNOSIS — O1203 Gestational edema, third trimester: Secondary | ICD-10-CM | POA: Diagnosis not present

## 2020-11-03 DIAGNOSIS — O0933 Supervision of pregnancy with insufficient antenatal care, third trimester: Secondary | ICD-10-CM

## 2020-11-03 NOTE — Progress Notes (Signed)
Patient was seen on 10/27/20 follow-up visit for Gestational Diabetes self-management. Estimated due date: 12/10/20; [redacted]w[redacted]d  Clinical: Medications: prenatal vitamin, aspirin Medical History: GDM with last pregnancy, cesarean delivery Labs: OGTT all 3 elevated, A1c 6.0% 07/21/20  Since her last nutrition visit her BG has continued to improve: FBS: 84-91; PPBG 74-124, avg ~110 mg/dL  Dietary and Lifestyle History: Pt states she is continues to add more protein to her meals. Pt is likely eating 15-30 grams carbohydrates per meal. Pt states she is satisfied with her new eating patterns and feels she can continue postpartum. One concern that she has is anticipation that she will not have as much time to prepare foods and will need to rely on convenience foods.  Pt states she is feeling better about being able to eat when hungry at night and still be able to have her FBS in the normal range.   Physical Activity: house chores, walks with her kids to the playground Stress:  Sleep: ~8 hrs, wakes up when husband gets home during the night.  24 hr Recall: First Meal: egg, injera, 1/2 c skim milk in coffee Snack: Second meal: vegetables, injera, 1/2 c plain [place] yogurt Snack: Third meal: smoothie made with avocado, spinach, 1/2 banana, 1/2 apple. (Sometimes adds nuts).  Snack: piece of fruit or nuts Beverages: water, coffee, skim milk  NUTRITION INTERVENTION  Nutrition education (E-1) on the following topics:  (2nd follow-up visit) Importance to continue changes after delivery. This way of eating is healthy for all family members and doesn't need to make separate meals for everyone. If blood sugar goes up with current eating patterns can try walking after meals that have more carbohydrates, but not advisable to reduce carb intake further.  Patient instructed to monitor glucose levels: FBS: 60 - ? 95 mg/dL (some clinics use 90 for cutoff) 1 hour: ? 140 mg/dL 2 hour: ? 120 mg/dL  Patient  received handouts: none  Patient will be seen for follow-up prn

## 2020-11-03 NOTE — ED Triage Notes (Signed)
C/o contractions x 1 week, worse today. 1 hour apart. No discharge, feeling baby moving. [facility]. Due date Aug 20.

## 2020-11-04 ENCOUNTER — Encounter (HOSPITAL_COMMUNITY): Payer: Self-pay | Admitting: Obstetrics and Gynecology

## 2020-11-04 ENCOUNTER — Telehealth: Payer: Self-pay

## 2020-11-04 ENCOUNTER — Other Ambulatory Visit: Payer: Self-pay

## 2020-11-04 DIAGNOSIS — Z3A34 34 weeks gestation of pregnancy: Secondary | ICD-10-CM | POA: Diagnosis not present

## 2020-11-04 DIAGNOSIS — O4703 False labor before 37 completed weeks of gestation, third trimester: Secondary | ICD-10-CM

## 2020-11-04 DIAGNOSIS — R109 Unspecified abdominal pain: Secondary | ICD-10-CM | POA: Diagnosis present

## 2020-11-04 DIAGNOSIS — Z20822 Contact with and (suspected) exposure to covid-19: Secondary | ICD-10-CM | POA: Diagnosis not present

## 2020-11-04 DIAGNOSIS — O1203 Gestational edema, third trimester: Secondary | ICD-10-CM | POA: Diagnosis not present

## 2020-11-04 DIAGNOSIS — O24420 Gestational diabetes mellitus in childbirth, diet controlled: Secondary | ICD-10-CM | POA: Diagnosis not present

## 2020-11-04 DIAGNOSIS — O4593 Premature separation of placenta, unspecified, third trimester: Secondary | ICD-10-CM | POA: Diagnosis not present

## 2020-11-04 DIAGNOSIS — Z3A35 35 weeks gestation of pregnancy: Secondary | ICD-10-CM | POA: Diagnosis not present

## 2020-11-04 DIAGNOSIS — O99824 Streptococcus B carrier state complicating childbirth: Secondary | ICD-10-CM | POA: Diagnosis not present

## 2020-11-04 DIAGNOSIS — O9902 Anemia complicating childbirth: Secondary | ICD-10-CM | POA: Diagnosis not present

## 2020-11-04 DIAGNOSIS — O99214 Obesity complicating childbirth: Secondary | ICD-10-CM | POA: Diagnosis not present

## 2020-11-04 DIAGNOSIS — O3413 Maternal care for benign tumor of corpus uteri, third trimester: Secondary | ICD-10-CM | POA: Diagnosis not present

## 2020-11-04 DIAGNOSIS — O34211 Maternal care for low transverse scar from previous cesarean delivery: Secondary | ICD-10-CM | POA: Diagnosis not present

## 2020-11-04 DIAGNOSIS — D259 Leiomyoma of uterus, unspecified: Secondary | ICD-10-CM | POA: Diagnosis not present

## 2020-11-04 LAB — URINALYSIS, ROUTINE W REFLEX MICROSCOPIC
Bilirubin Urine: NEGATIVE
Glucose, UA: NEGATIVE mg/dL
Hgb urine dipstick: NEGATIVE
Ketones, ur: NEGATIVE mg/dL
Leukocytes,Ua: NEGATIVE
Nitrite: NEGATIVE
Protein, ur: NEGATIVE mg/dL
Specific Gravity, Urine: 1.01 (ref 1.005–1.030)
pH: 7 (ref 5.0–8.0)

## 2020-11-04 LAB — RESP PANEL BY RT-PCR (FLU A&B, COVID) ARPGX2
Influenza A by PCR: NEGATIVE
Influenza B by PCR: NEGATIVE
SARS Coronavirus 2 by RT PCR: NEGATIVE

## 2020-11-04 MED ORDER — NIFEDIPINE 10 MG PO CAPS
10.0000 mg | ORAL_CAPSULE | ORAL | Status: DC | PRN
  Administered 2020-11-04 (×3): 10 mg via ORAL
  Filled 2020-11-04 (×3): qty 1

## 2020-11-04 MED ORDER — LACTATED RINGERS IV BOLUS
1000.0000 mL | Freq: Once | INTRAVENOUS | Status: AC
  Administered 2020-11-04: 1000 mL via INTRAVENOUS

## 2020-11-04 MED ORDER — TERBUTALINE SULFATE 1 MG/ML IJ SOLN
0.2500 mg | Freq: Once | INTRAMUSCULAR | Status: AC
  Administered 2020-11-04: 0.25 mg via SUBCUTANEOUS
  Filled 2020-11-04: qty 1

## 2020-11-04 NOTE — ED Notes (Signed)
Fetal heart tones 146 bpm

## 2020-11-04 NOTE — Progress Notes (Signed)
Received call from [place]. Patient presents with c/o contractions for one week, worsening in pain today, and states the contractions are one hour apart. Patient placed on monitor and showing in [place].   Wendi Maya, RN [place]

## 2020-11-04 NOTE — Progress Notes (Signed)
Contacted [facility], requested to adjust U/S as baby was off the monitor.   Wendi Maya, RN [place]

## 2020-11-04 NOTE — Telephone Encounter (Signed)
Called to check on pt per Hansel Feinstein, CNM after being seen in the hospital for premature contractions. Pt stated that she was back home and doing well now. She denies any more contractions. States that baby is moving well and there are no concerns at this time. Instructed to call the office if she needed anything. Pt confirmed understanding.

## 2020-11-04 NOTE — Progress Notes (Signed)
Spoke with attending OB and provided report/triage of patient as well as FHT. Attending advised patient will need to be transferred to MAU and also requested patient to received a 1 liter bolus of fluid on the way here. I also contacted our MAU provider and provided report of patient.   Spoke with [facility] RN and advised of above recommendations of our attending. RN advised she will provide report to her ED attending.   Wendi Maya, RN [place]

## 2020-11-04 NOTE — ED Provider Notes (Signed)
[facility] EMERGENCY DEPARTMENT Provider Note   CSN: [id number] Arrival date & time: 11/03/20  2354     History Chief Complaint  Patient presents with   Contractions    Kayla Walker is a 40 y.o. female.  The history is provided by the patient.  She has history of gestational diabetes and is currently pregnant at [redacted] weeks 6 days with estimated date of delivery 12/10/2020.  She is G3 P2.  She comes in because of pain across the lower abdomen and into her back.  Pain has been present for the last 4 days.  It comes and goes.  It would last for about an hour before resolving.  When present, pain is rated at 6/10.  She had brief nausea on one occasion.  She denies fever, chills, sweats.  She has not had any vaginal bleeding or discharge.  She has noted some increase in urinary frequency.   Past Medical History:  Diagnosis Date   Gestational diabetes    diet controlled   Medical history non-contributory     Patient Active Problem List   Diagnosis Date Noted   AMA (advanced maternal age) multigravida 35+ 10/05/2020   Limited prenatal care 10/05/2020   History of gestational diabetes in prior pregnancy, currently pregnant 07/21/2020   Supervision of high risk pregnancy, antepartum 07/21/2020   Gestational diabetes mellitus, class A1 08/16/2016   Uterine fibroids 07/30/2016   History of cesarean delivery 04/24/2016    Past Surgical History:  Procedure Laterality Date   CESAREAN SECTION     CESAREAN SECTION N/A 10/26/2016   Procedure: CESAREAN SECTION;  Surgeon: Chancy Milroy, MD;  Location: [facility];  Service: Obstetrics;  Laterality: N/A;     OB History     Gravida  3   Para  2   Term  2   Preterm      AB      Living  2      SAB      IAB      Ectopic      Multiple  0   Live Births  2           Family History  Problem Relation Age of Onset   Autism Daughter    Alcohol abuse Neg Hx    Arthritis Neg Hx    Asthma Neg Hx    Birth defects  Neg Hx    Cancer Neg Hx    COPD Neg Hx    Depression Neg Hx    Diabetes Neg Hx    Drug abuse Neg Hx    Early death Neg Hx    Hearing loss Neg Hx    Hyperlipidemia Neg Hx    Heart disease Neg Hx    Hypertension Neg Hx    Kidney disease Neg Hx    Learning disabilities Neg Hx    Mental illness Neg Hx    Mental retardation Neg Hx    Miscarriages / Stillbirths Neg Hx    Stroke Neg Hx    Vision loss Neg Hx    Varicose Veins Neg Hx     Social History   Tobacco Use   Smoking status: Never   Smokeless tobacco: Never  Vaping Use   Vaping Use: Never used  Substance Use Topics   Alcohol use: No   Drug use: No    Home Medications Prior to Admission medications   Medication Sig Start Date End Date Taking? Authorizing Provider  Accu-Chek Softclix Lancets  lancets Check 4 times daily 10/10/20   Roma Schanz, CNM  aspirin 81 MG EC tablet Take 1 tablet (81 mg total) by mouth daily. Swallow whole. Patient not taking: No sig reported 07/21/20   Roma Schanz, CNM  Blood Glucose Monitoring Suppl (ACCU-CHEK GUIDE ME) w/Device KIT 1 kit by Does not apply route in the morning, at noon, in the evening, and at bedtime. 10/10/20 11/09/20  Roma Schanz, CNM  glucose blood (ACCU-CHEK GUIDE) test strip Check 4 times daily 10/10/20   Roma Schanz, CNM  Prenatal Vit-Fe Fumarate-FA (MULTIVITAMIN-PRENATAL) 27-0.8 MG TABS tablet Take 1 tablet by mouth daily.     [provider]    Allergies    Patient has no known allergies.  Review of Systems   Review of Systems  All other systems reviewed and are negative.  Physical Exam Updated Vital Signs BP 132/73   Ht 5' 5" (1.651 m)   Wt 86.2 kg   LMP 03/05/2020   BMI 31.62 kg/m   Physical Exam Vitals and nursing note reviewed. Exam conducted with a chaperone present.  40 year old female, resting comfortably and in no acute distress. Vital signs are normal. Head is normocephalic and atraumatic. PERRLA, EOMI. Oropharynx is  clear. Neck is nontender and supple without adenopathy or JVD. Back is nontender and there is no CVA tenderness. Lungs are clear without rales, wheezes, or rhonchi. Chest is nontender. Heart has regular rate and rhythm without murmur. Abdomen is soft.  Gravid uterus present which is nontender, size consistent with dates. Pelvic: Normal external female genitalia.  Digital exam shows fetal head ot engaged, cervix is very far posterior. Extremities have 1+ edema, full range of motion is present. Skin is warm and dry without rash. Neurologic: Mental status is normal, cranial nerves are intact, there are no motor or sensory deficits.  ED Results / Procedures / Treatments   Labs (all labs ordered are listed, but only abnormal results are displayed) Labs Reviewed - No data to display  Procedures Procedures   Medications Ordered in ED Medications  lactated ringers bolus 1,000 mL (has no administration in time range)    ED Course  I have reviewed the triage vital signs and the nursing notes.  Pertinent lab results that were available during my care of the patient were reviewed by me and considered in my medical decision making (see chart for details).   MDM Rules/Calculators/A&P                         Lower abdominal pain and patient in third trimester pregnancy.  Pattern is not suggestive of labor or Braxton Hicks contractions.  Possible urinary tract infection.  Old records are reviewed confirming patient getting prenatal care for high risk pregnancy because of advanced maternal age and diabetes.  Fetal monitoring is initiated and will send urine for analysis.  Dr. Dara Lords of obstetrics and gynecology services reviewed her telemetry data and request she get 1 L of fluid and be transferred to maternal admissions unit at [facility].  This is done.  Patient has not given a urine sample at this point.  Final Clinical Impression(s) / ED Diagnoses Final diagnoses:  Abdominal pain  during pregnancy in third trimester  Peripheral edema    Rx / DC Orders ED Discharge Orders     None        Delora Fuel, MD 43/15/40 0045

## 2020-11-04 NOTE — MAU Provider Note (Signed)
Chief Complaint:  Contractions   Event Date/Time   First Provider Initiated Contact with Patient 11/04/20 0153     HPI: Kayla Walker is a 40 y.o. G3P2002 at 103w6dho [facility] reporting painful contractions for one week   Has not called office to notify them.  . She reports good fetal movement, denies LOF, vaginal bleeding, vaginal itching/burning, urinary symptoms, h/a, dizziness, n/v, diarrhea, constipation or fever/chills.    Abdominal Pain This is a recurrent problem. The current episode started in the past 7 days. The onset quality is gradual. The problem occurs intermittently. The problem has been unchanged. The quality of the pain is cramping. The abdominal pain does not radiate. Pertinent negatives include no constipation, diarrhea, dysuria, fever, frequency, myalgias, nausea or vomiting. Nothing aggravates the pain. The pain is relieved by Nothing. She has tried nothing for the symptoms.   RN Note: C/o contractions x 1 week, worse today. 1 hour apart. No discharge, feeling baby moving. [facility]. Due date Aug 20.  Past Medical History: Past Medical History:  Diagnosis Date   Gestational diabetes    diet controlled   Medical history non-contributory     Past obstetric history: OB History  Gravida Para Term Preterm AB Living  _0 SAB IAB Ectopic Multiple Live Births        0 2    # Outcome Date GA Lbr Len/2nd Weight Sex Delivery Anes PTL Lv  3 Current           2 Term 10/26/16 [id number] g F CS-LTranv Spinal N LIV     Complications: Gestational diabetes  1 Term 08/24/15 [id number] g F CS-LTranv EPI N LIV     Complications: Failure to Progress in Second Stage    Past Surgical History: Past Surgical History:  Procedure Laterality Date   CESAREAN SECTION     CESAREAN SECTION N/A 10/26/2016   Procedure: CESAREAN SECTION;  Surgeon: ErvChancy MilroyD;  [facility] [place]  [facility];  Laterality: N/A;    Family History: Family History  Problem Relation Age of Onset   Autism Daughter    Alcohol abuse Neg Hx    Arthritis Neg Hx    Asthma Neg Hx    Birth defects Neg Hx    Cancer Neg Hx    COPD Neg Hx    Depression Neg Hx    Diabetes Neg Hx    Drug abuse Neg Hx    Early death Neg Hx    Hearing loss Neg Hx    Hyperlipidemia Neg Hx    Heart disease Neg Hx    Hypertension Neg Hx    Kidney disease Neg Hx    Learning disabilities Neg Hx    Mental illness Neg Hx    Mental retardation Neg Hx    Miscarriages / Stillbirths Neg Hx    Stroke Neg Hx    Vision loss Neg Hx    Varicose Veins Neg Hx     Social History: Social History   Tobacco Use   Smoking status: Never   Smokeless tobacco: Never  Vaping Use   Vaping Use: Never used  Substance Use Topics   Alcohol use: No   Drug use: No    Allergies: No Known Allergies  Meds:  Medications Prior to Admission  Medication Sig Dispense Refill Last Dose   Accu-Chek Softclix Lancets lancets  Check 4 times daily 100 each 12    aspirin 81 MG EC tablet Take 1 tablet (81 mg total) by mouth daily. Swallow whole. (Patient not taking: No sig reported) 90 tablet 3    Blood Glucose Monitoring Suppl (ACCU-CHEK GUIDE ME) w/Device KIT 1 kit by Does not apply route in the morning, at noon, in the evening, and at bedtime. 1 kit 0    glucose blood (ACCU-CHEK GUIDE) test strip Check 4 times daily 50 each 12    Prenatal Vit-Fe Fumarate-FA (MULTIVITAMIN-PRENATAL) 27-0.8 MG TABS tablet Take 1 tablet by mouth daily.        I have reviewed patient's Past Medical Hx, Surgical Hx, Family Hx, Social Hx, medications and allergies.   ROS:  Review of Systems  Constitutional:  Negative for fever.  Gastrointestinal:  Positive for abdominal pain. Negative for constipation, diarrhea, nausea and vomiting.  Genitourinary:  Negative for dysuria and frequency.  Musculoskeletal:  Negative for myalgias.  Other systems  negative  Physical Exam  Patient Vitals for the past 24 hrs:  BP Temp Temp src Pulse Resp SpO2 Height Weight  11/04/20 0102 -- -- -- -- -- -- _0  (1.651 m) 86.2 kg  11/04/20 0100 123/67 -- -- 72 20 100 % -- --  11/04/20 0045 -- 97.8 F (36.6 C) Oral 78 18 100 % -- --  11/04/20 0030 124/73 -- -- 80 20 100 % -- --  11/04/20 0002 132/73 -- -- -- -- -- -- --  11/03/20 2359 -- -- -- -- -- -- _1  (1.651 m) 86.2 kg   Constitutional: Well-developed, well-nourished female in no acute distress.  Cardiovascular: normal rate and rhythm Respiratory: normal effort GI: Abd soft, non-tender, gravid appropriate for gestational age.   No rebound or guarding. MS: Extremities nontender, no edema, normal ROM Neurologic: Alert and oriented x 4.  GU: Neg CVAT.  PELVIC EXAM:   Dilation: Closed Effacement (%): Thick Cervical Position: Posterior Station: Ballotable Presentation: Undeterminable Exam by:: Jimmye Norman CNM   FHT:  Baseline 140 , moderate variability, accelerations present, no decelerations Contractions: q 2-4 mins Irregular   Patient states she only feels a contraction every 30 minutes   Labs: Results for orders placed or performed during the hospital encounter of 11/03/20 (from the past 24 hour(s))  Urinalysis, Routine w reflex microscopic     Status: Abnormal   Collection Time: 11/04/20 12:17 AM  Result Value Ref Range   Color, Urine YELLOW YELLOW   APPearance HAZY (A) CLEAR   Specific Gravity, Urine 1.010 1.005 - 1.030   pH 7.0 5.0 - 8.0   Glucose, UA NEGATIVE NEGATIVE mg/dL   Hgb urine dipstick NEGATIVE NEGATIVE   Bilirubin Urine NEGATIVE NEGATIVE   Ketones, ur NEGATIVE NEGATIVE mg/dL   Protein, ur NEGATIVE NEGATIVE mg/dL   Nitrite NEGATIVE NEGATIVE   Leukocytes,Ua NEGATIVE NEGATIVE  Resp Panel by RT-PCR (Flu A&B, Covid) Nasopharyngeal Swab     Status: None   Collection Time: 11/04/20 12:46 AM   Specimen: Nasopharyngeal Swab; Nasopharyngeal(NP) swabs in vial transport  medium  Result Value Ref Range   SARS Coronavirus 2 by RT PCR NEGATIVE NEGATIVE   Influenza A by PCR NEGATIVE NEGATIVE   Influenza B by PCR NEGATIVE NEGATIVE   A/Positive/-- (03/31 1212)  Imaging:    MAU Course/MDM: I have ordered labs and reviewed results. UA is clear. NST reviewed, reassuring  Treatments in MAU included 2 liters of IV Fluid, and Procardia series.  UCs persisted so Terbutaline given. This  slowed contractions and patient states she did not have pain with the few that she continued to have. .    Assessment: Single IUP at 49w6dPreterm uterine contractions with no cervical change Hx C/S x 2, desires TOLAC  Plan: Discharge home Preterm Labor precautions and fetal kick counts Follow up in Office for prenatal visits  Encouraged to return if she develops worsening of symptoms, increase in pain, fever, or other concerning symptoms.  Pt stable at time of discharge.  MHansel FeinsteinCNM, MSN Certified Nurse-Midwife 11/04/2020 1:53 AM

## 2020-11-05 ENCOUNTER — Other Ambulatory Visit: Payer: Self-pay

## 2020-11-05 ENCOUNTER — Inpatient Hospital Stay (HOSPITAL_COMMUNITY): Payer: Medicaid Other | Admitting: Anesthesiology

## 2020-11-05 ENCOUNTER — Encounter (HOSPITAL_COMMUNITY): Payer: Self-pay | Admitting: Obstetrics & Gynecology

## 2020-11-05 ENCOUNTER — Inpatient Hospital Stay (HOSPITAL_COMMUNITY)
Admission: AD | Admit: 2020-11-05 | Discharge: 2020-11-08 | DRG: 788 | Disposition: A | Discharge status: Home / Self Care | Payer: Medicaid Other | Attending: Obstetrics & Gynecology | Admitting: Obstetrics & Gynecology

## 2020-11-05 ENCOUNTER — Encounter (HOSPITAL_COMMUNITY)
Admission: AD | Disposition: A | Discharge status: Home / Self Care | Payer: Self-pay | Source: Home / Self Care | Attending: Obstetrics & Gynecology

## 2020-11-05 DIAGNOSIS — D259 Leiomyoma of uterus, unspecified: Secondary | ICD-10-CM | POA: Diagnosis not present

## 2020-11-05 DIAGNOSIS — O209 Hemorrhage in early pregnancy, unspecified: Secondary | ICD-10-CM | POA: Diagnosis not present

## 2020-11-05 DIAGNOSIS — Z3A35 35 weeks gestation of pregnancy: Secondary | ICD-10-CM

## 2020-11-05 DIAGNOSIS — R109 Unspecified abdominal pain: Secondary | ICD-10-CM | POA: Diagnosis not present

## 2020-11-05 DIAGNOSIS — Z331 Pregnant state, incidental: Secondary | ICD-10-CM

## 2020-11-05 DIAGNOSIS — Z98891 History of uterine scar from previous surgery: Secondary | ICD-10-CM

## 2020-11-05 DIAGNOSIS — O99824 Streptococcus B carrier state complicating childbirth: Secondary | ICD-10-CM | POA: Diagnosis not present

## 2020-11-05 DIAGNOSIS — O4593 Premature separation of placenta, unspecified, third trimester: Secondary | ICD-10-CM | POA: Diagnosis not present

## 2020-11-05 DIAGNOSIS — O99214 Obesity complicating childbirth: Secondary | ICD-10-CM | POA: Diagnosis present

## 2020-11-05 DIAGNOSIS — Z20822 Contact with and (suspected) exposure to covid-19: Secondary | ICD-10-CM | POA: Diagnosis not present

## 2020-11-05 DIAGNOSIS — O24424 Gestational diabetes mellitus in childbirth, insulin controlled: Secondary | ICD-10-CM | POA: Diagnosis not present

## 2020-11-05 DIAGNOSIS — O3413 Maternal care for benign tumor of corpus uteri, third trimester: Secondary | ICD-10-CM | POA: Diagnosis not present

## 2020-11-05 DIAGNOSIS — O9902 Anemia complicating childbirth: Secondary | ICD-10-CM | POA: Diagnosis present

## 2020-11-05 DIAGNOSIS — R58 Hemorrhage, not elsewhere classified: Secondary | ICD-10-CM | POA: Diagnosis not present

## 2020-11-05 DIAGNOSIS — Z3A37 37 weeks gestation of pregnancy: Secondary | ICD-10-CM | POA: Diagnosis not present

## 2020-11-05 DIAGNOSIS — O09529 Supervision of elderly multigravida, unspecified trimester: Secondary | ICD-10-CM

## 2020-11-05 DIAGNOSIS — O0930 Supervision of pregnancy with insufficient antenatal care, unspecified trimester: Secondary | ICD-10-CM

## 2020-11-05 DIAGNOSIS — O1203 Gestational edema, third trimester: Secondary | ICD-10-CM | POA: Diagnosis not present

## 2020-11-05 DIAGNOSIS — O99820 Streptococcus B carrier state complicating pregnancy: Secondary | ICD-10-CM | POA: Diagnosis not present

## 2020-11-05 DIAGNOSIS — Z8632 Personal history of gestational diabetes: Secondary | ICD-10-CM | POA: Diagnosis present

## 2020-11-05 DIAGNOSIS — O24410 Gestational diabetes mellitus in pregnancy, diet controlled: Secondary | ICD-10-CM | POA: Diagnosis present

## 2020-11-05 DIAGNOSIS — Z3A34 34 weeks gestation of pregnancy: Secondary | ICD-10-CM | POA: Diagnosis not present

## 2020-11-05 DIAGNOSIS — O34211 Maternal care for low transverse scar from previous cesarean delivery: Secondary | ICD-10-CM | POA: Diagnosis not present

## 2020-11-05 DIAGNOSIS — O4703 False labor before 37 completed weeks of gestation, third trimester: Secondary | ICD-10-CM | POA: Diagnosis not present

## 2020-11-05 DIAGNOSIS — R1084 Generalized abdominal pain: Secondary | ICD-10-CM | POA: Diagnosis not present

## 2020-11-05 DIAGNOSIS — O24420 Gestational diabetes mellitus in childbirth, diet controlled: Secondary | ICD-10-CM | POA: Diagnosis present

## 2020-11-05 LAB — CBC
HCT: 37 % (ref 36.0–46.0)
Hemoglobin: 11.8 g/dL — ABNORMAL LOW (ref 12.0–15.0)
MCH: 26.8 pg (ref 26.0–34.0)
MCHC: 31.9 g/dL (ref 30.0–36.0)
MCV: 83.9 fL (ref 80.0–100.0)
Platelets: 230 10*3/uL (ref 150–400)
RBC: 4.41 MIL/uL (ref 3.87–5.11)
RDW: 15.2 % (ref 11.5–15.5)
WBC: 8.1 10*3/uL (ref 4.0–10.5)
nRBC: 0 % (ref 0.0–0.2)

## 2020-11-05 LAB — I-STAT CHEM 8, ED
BUN: 6 mg/dL (ref 6–20)
Calcium, Ion: 1.15 mmol/L (ref 1.15–1.40)
Chloride: 104 mmol/L (ref 98–111)
Creatinine, Ser: 0.3 mg/dL — ABNORMAL LOW (ref 0.44–1.00)
Glucose, Bld: 83 mg/dL (ref 70–99)
HCT: 42 % (ref 36.0–46.0)
Hemoglobin: 14.3 g/dL (ref 12.0–15.0)
Potassium: 3.8 mmol/L (ref 3.5–5.1)
Sodium: 137 mmol/L (ref 135–145)
TCO2: 22 mmol/L (ref 22–32)

## 2020-11-05 LAB — TYPE AND SCREEN
ABO/RH(D): A POS
Antibody Screen: NEGATIVE

## 2020-11-05 LAB — GLUCOSE, CAPILLARY: Glucose-Capillary: 94 mg/dL (ref 70–99)

## 2020-11-05 SURGERY — Surgical Case
Anesthesia: Spinal

## 2020-11-05 MED ORDER — CEFAZOLIN SODIUM-DEXTROSE 2-4 GM/100ML-% IV SOLN
2.0000 g | INTRAVENOUS | Status: AC
  Administered 2020-11-05: 2 g via INTRAVENOUS

## 2020-11-05 MED ORDER — ACETAMINOPHEN 500 MG PO TABS
ORAL_TABLET | ORAL | Status: AC
  Filled 2020-11-05: qty 2

## 2020-11-05 MED ORDER — OXYTOCIN-SODIUM CHLORIDE 30-0.9 UT/500ML-% IV SOLN
2.5000 [IU]/h | INTRAVENOUS | Status: AC
  Administered 2020-11-05: 2.5 [IU]/h via INTRAVENOUS
  Filled 2020-11-05: qty 500

## 2020-11-05 MED ORDER — TERBUTALINE SULFATE 1 MG/ML IJ SOLN
0.2500 mg | Freq: Once | INTRAMUSCULAR | Status: AC
  Administered 2020-11-05: 0.25 mg via SUBCUTANEOUS
  Filled 2020-11-05: qty 1

## 2020-11-05 MED ORDER — ACETAMINOPHEN 500 MG PO TABS
1000.0000 mg | ORAL_TABLET | Freq: Four times a day (QID) | ORAL | Status: DC
  Administered 2020-11-06 – 2020-11-08 (×9): 1000 mg via ORAL
  Filled 2020-11-05 (×10): qty 2

## 2020-11-05 MED ORDER — METHYLERGONOVINE MALEATE 0.2 MG/ML IJ SOLN
INTRAMUSCULAR | Status: AC
  Filled 2020-11-05: qty 1

## 2020-11-05 MED ORDER — TRANEXAMIC ACID-NACL 1000-0.7 MG/100ML-% IV SOLN
INTRAVENOUS | Status: DC | PRN
  Administered 2020-11-05: 1000 mg via INTRAVENOUS

## 2020-11-05 MED ORDER — METOCLOPRAMIDE HCL 5 MG/ML IJ SOLN
INTRAMUSCULAR | Status: AC
  Filled 2020-11-05: qty 2

## 2020-11-05 MED ORDER — DIPHENHYDRAMINE HCL 25 MG PO CAPS: 25.0000 mg | ORAL_CAPSULE | ORAL | Status: DC | PRN

## 2020-11-05 MED ORDER — LACTATED RINGERS IV SOLN: INTRAVENOUS | Status: DC

## 2020-11-05 MED ORDER — NALOXONE HCL 4 MG/10ML IJ SOLN
1.0000 ug/kg/h | INTRAVENOUS | Status: DC | PRN
  Filled 2020-11-05: qty 5

## 2020-11-05 MED ORDER — SCOPOLAMINE 1 MG/3DAYS TD PT72: 1.0000 | MEDICATED_PATCH | Freq: Once | TRANSDERMAL | Status: DC

## 2020-11-05 MED ORDER — DIPHENHYDRAMINE HCL 50 MG/ML IJ SOLN: 12.5000 mg | INTRAMUSCULAR | Status: DC | PRN

## 2020-11-05 MED ORDER — FENTANYL CITRATE (PF) 100 MCG/2ML IJ SOLN
INTRAMUSCULAR | Status: AC
  Filled 2020-11-05: qty 2

## 2020-11-05 MED ORDER — ONDANSETRON HCL 4 MG/2ML IJ SOLN
INTRAMUSCULAR | Status: DC | PRN
  Administered 2020-11-05: 8 mg via INTRAVENOUS

## 2020-11-05 MED ORDER — DEXAMETHASONE SODIUM PHOSPHATE 10 MG/ML IJ SOLN
INTRAMUSCULAR | Status: DC | PRN
  Administered 2020-11-05: 10 mg via INTRAVENOUS

## 2020-11-05 MED ORDER — DIBUCAINE (PERIANAL) 1 % EX OINT: 1.0000 "application " | TOPICAL_OINTMENT | CUTANEOUS | Status: DC | PRN

## 2020-11-05 MED ORDER — DIPHENHYDRAMINE HCL 25 MG PO CAPS: 25.0000 mg | ORAL_CAPSULE | Freq: Four times a day (QID) | ORAL | Status: DC | PRN

## 2020-11-05 MED ORDER — ONDANSETRON HCL 4 MG/2ML IJ SOLN
INTRAMUSCULAR | Status: AC
  Filled 2020-11-05: qty 4

## 2020-11-05 MED ORDER — OXYTOCIN-SODIUM CHLORIDE 30-0.9 UT/500ML-% IV SOLN
INTRAVENOUS | Status: DC | PRN
  Administered 2020-11-05: 30 [IU] via INTRAVENOUS

## 2020-11-05 MED ORDER — FENTANYL CITRATE (PF) 100 MCG/2ML IJ SOLN
INTRAMUSCULAR | Status: DC | PRN
  Administered 2020-11-05: 15 ug via INTRATHECAL

## 2020-11-05 MED ORDER — NALBUPHINE HCL 10 MG/ML IJ SOLN: 5.0000 mg | INTRAMUSCULAR | Status: DC | PRN

## 2020-11-05 MED ORDER — ONDANSETRON HCL 4 MG/2ML IJ SOLN: 4.0000 mg | Freq: Three times a day (TID) | INTRAMUSCULAR | Status: DC | PRN

## 2020-11-05 MED ORDER — SIMETHICONE 80 MG PO CHEW
80.0000 mg | CHEWABLE_TABLET | Freq: Three times a day (TID) | ORAL | Status: DC
  Administered 2020-11-05 – 2020-11-08 (×8): 80 mg via ORAL
  Filled 2020-11-05 (×9): qty 1

## 2020-11-05 MED ORDER — SIMETHICONE 80 MG PO CHEW
80.0000 mg | CHEWABLE_TABLET | ORAL | Status: DC | PRN
  Filled 2020-11-05: qty 1

## 2020-11-05 MED ORDER — KETOROLAC TROMETHAMINE 30 MG/ML IJ SOLN
30.0000 mg | Freq: Four times a day (QID) | INTRAMUSCULAR | Status: AC
  Administered 2020-11-05 – 2020-11-06 (×2): 30 mg via INTRAVENOUS
  Filled 2020-11-05 (×3): qty 1

## 2020-11-05 MED ORDER — PHENYLEPHRINE HCL-NACL 20-0.9 MG/250ML-% IV SOLN
INTRAVENOUS | Status: DC | PRN
  Administered 2020-11-05: 60 ug/min via INTRAVENOUS

## 2020-11-05 MED ORDER — SODIUM CHLORIDE 0.9 % IR SOLN
Status: DC | PRN
  Administered 2020-11-05: 1

## 2020-11-05 MED ORDER — METHYLERGONOVINE MALEATE 0.2 MG/ML IJ SOLN
INTRAMUSCULAR | Status: DC | PRN
  Administered 2020-11-05: .2 mg via INTRAMUSCULAR

## 2020-11-05 MED ORDER — MORPHINE SULFATE (PF) 0.5 MG/ML IJ SOLN
INTRAMUSCULAR | Status: DC | PRN
  Administered 2020-11-05: .15 mg via INTRATHECAL

## 2020-11-05 MED ORDER — ACETAMINOPHEN 500 MG PO TABS
1000.0000 mg | ORAL_TABLET | Freq: Four times a day (QID) | ORAL | Status: AC
  Administered 2020-11-05 – 2020-11-06 (×3): 1000 mg via ORAL
  Filled 2020-11-05 (×2): qty 2

## 2020-11-05 MED ORDER — SODIUM CHLORIDE 0.9% FLUSH: 3.0000 mL | INTRAVENOUS | Status: DC | PRN

## 2020-11-05 MED ORDER — STERILE WATER FOR IRRIGATION IR SOLN
Status: DC | PRN
  Administered 2020-11-05: 1

## 2020-11-05 MED ORDER — TETANUS-DIPHTH-ACELL PERTUSSIS 5-2.5-18.5 LF-MCG/0.5 IM SUSY: 0.5000 mL | PREFILLED_SYRINGE | Freq: Once | INTRAMUSCULAR | Status: DC

## 2020-11-05 MED ORDER — PHENYLEPHRINE HCL-NACL 20-0.9 MG/250ML-% IV SOLN
INTRAVENOUS | Status: AC
  Filled 2020-11-05: qty 250

## 2020-11-05 MED ORDER — LACTATED RINGERS IV BOLUS
1000.0000 mL | Freq: Once | INTRAVENOUS | Status: AC
  Administered 2020-11-05: 1000 mL via INTRAVENOUS

## 2020-11-05 MED ORDER — SENNOSIDES-DOCUSATE SODIUM 8.6-50 MG PO TABS
2.0000 | ORAL_TABLET | Freq: Every day | ORAL | Status: DC
  Administered 2020-11-06 – 2020-11-08 (×3): 2 via ORAL
  Filled 2020-11-05 (×4): qty 2

## 2020-11-05 MED ORDER — PRENATAL MULTIVITAMIN CH
1.0000 | ORAL_TABLET | Freq: Every day | ORAL | Status: DC
  Administered 2020-11-06 – 2020-11-08 (×3): 1 via ORAL
  Filled 2020-11-05 (×4): qty 1

## 2020-11-05 MED ORDER — METOCLOPRAMIDE HCL 5 MG/ML IJ SOLN
INTRAMUSCULAR | Status: DC | PRN
  Administered 2020-11-05: 10 mg via INTRAVENOUS

## 2020-11-05 MED ORDER — FENTANYL CITRATE (PF) 100 MCG/2ML IJ SOLN: 25.0000 ug | INTRAMUSCULAR | Status: DC | PRN

## 2020-11-05 MED ORDER — SODIUM CHLORIDE 0.9 % IV BOLUS
500.0000 mL | Freq: Once | INTRAVENOUS | Status: AC
  Administered 2020-11-05: 500 mL via INTRAVENOUS

## 2020-11-05 MED ORDER — KETOROLAC TROMETHAMINE 30 MG/ML IJ SOLN
30.0000 mg | Freq: Four times a day (QID) | INTRAMUSCULAR | Status: AC | PRN
  Administered 2020-11-05 – 2020-11-06 (×2): 30 mg via INTRAVENOUS

## 2020-11-05 MED ORDER — COCONUT OIL OIL
1.0000 | TOPICAL_OIL | Status: DC | PRN
Start: 2020-11-05 — End: 2020-11-08

## 2020-11-05 MED ORDER — TRANEXAMIC ACID-NACL 1000-0.7 MG/100ML-% IV SOLN
INTRAVENOUS | Status: AC
  Filled 2020-11-05: qty 100

## 2020-11-05 MED ORDER — IBUPROFEN 600 MG PO TABS
600.0000 mg | ORAL_TABLET | Freq: Four times a day (QID) | ORAL | Status: DC
  Administered 2020-11-06 – 2020-11-08 (×8): 600 mg via ORAL
  Filled 2020-11-05 (×9): qty 1

## 2020-11-05 MED ORDER — SCOPOLAMINE 1 MG/3DAYS TD PT72
MEDICATED_PATCH | TRANSDERMAL | Status: DC | PRN
  Administered 2020-11-05: 1 via TRANSDERMAL

## 2020-11-05 MED ORDER — WITCH HAZEL-GLYCERIN EX PADS: 1.0000 "application " | MEDICATED_PAD | CUTANEOUS | Status: DC | PRN

## 2020-11-05 MED ORDER — KETOROLAC TROMETHAMINE 30 MG/ML IJ SOLN
INTRAMUSCULAR | Status: AC
  Filled 2020-11-05: qty 1

## 2020-11-05 MED ORDER — KETOROLAC TROMETHAMINE 30 MG/ML IJ SOLN: 30.0000 mg | Freq: Four times a day (QID) | INTRAMUSCULAR | Status: AC | PRN

## 2020-11-05 MED ORDER — SOD CITRATE-CITRIC ACID 500-334 MG/5ML PO SOLN
ORAL | Status: AC
  Administered 2020-11-05: 30 mL
  Filled 2020-11-05: qty 30

## 2020-11-05 MED ORDER — MEPERIDINE HCL 25 MG/ML IJ SOLN: 6.2500 mg | INTRAMUSCULAR | Status: DC | PRN

## 2020-11-05 MED ORDER — CEFAZOLIN SODIUM-DEXTROSE 2-4 GM/100ML-% IV SOLN
INTRAVENOUS | Status: AC
  Filled 2020-11-05: qty 100

## 2020-11-05 MED ORDER — MORPHINE SULFATE (PF) 0.5 MG/ML IJ SOLN
INTRAMUSCULAR | Status: AC
  Filled 2020-11-05: qty 10

## 2020-11-05 MED ORDER — PROMETHAZINE HCL 25 MG/ML IJ SOLN: 6.2500 mg | INTRAMUSCULAR | Status: DC | PRN

## 2020-11-05 MED ORDER — ENOXAPARIN SODIUM 40 MG/0.4ML IJ SOSY
40.0000 mg | PREFILLED_SYRINGE | INTRAMUSCULAR | Status: DC
  Administered 2020-11-06 – 2020-11-08 (×3): 40 mg via SUBCUTANEOUS
  Filled 2020-11-05 (×3): qty 0.4

## 2020-11-05 MED ORDER — SOD CITRATE-CITRIC ACID 500-334 MG/5ML PO SOLN: 30.0000 mL | Freq: Once | ORAL | Status: DC

## 2020-11-05 MED ORDER — LACTATED RINGERS IV SOLN: INTRAVENOUS | Status: DC | PRN

## 2020-11-05 MED ORDER — MENTHOL 3 MG MT LOZG
1.0000 | LOZENGE | OROMUCOSAL | Status: DC | PRN
  Filled 2020-11-05: qty 9

## 2020-11-05 MED ORDER — NALBUPHINE HCL 10 MG/ML IJ SOLN
5.0000 mg | Freq: Once | INTRAMUSCULAR | Status: AC | PRN
  Administered 2020-11-06: 5 mg via INTRAVENOUS
  Filled 2020-11-05: qty 1

## 2020-11-05 MED ORDER — MEASLES, MUMPS & RUBELLA VAC IJ SOLR: 0.5000 mL | Freq: Once | INTRAMUSCULAR | Status: DC

## 2020-11-05 MED ORDER — NALOXONE HCL 0.4 MG/ML IJ SOLN: 0.4000 mg | INTRAMUSCULAR | Status: DC | PRN

## 2020-11-05 MED ORDER — OXYCODONE HCL 5 MG PO TABS
5.0000 mg | ORAL_TABLET | ORAL | Status: DC | PRN
  Administered 2020-11-06: 5 mg via ORAL
  Administered 2020-11-07 – 2020-11-08 (×4): 10 mg via ORAL
  Filled 2020-11-05: qty 1
  Filled 2020-11-05 (×4): qty 2

## 2020-11-05 MED ORDER — SCOPOLAMINE 1 MG/3DAYS TD PT72
MEDICATED_PATCH | TRANSDERMAL | Status: AC
  Filled 2020-11-05: qty 1

## 2020-11-05 MED ORDER — NALBUPHINE HCL 10 MG/ML IJ SOLN: 5.0000 mg | Freq: Once | INTRAMUSCULAR | Status: AC | PRN

## 2020-11-05 MED ORDER — BUPIVACAINE IN DEXTROSE 0.75-8.25 % IT SOLN
INTRATHECAL | Status: DC | PRN
  Administered 2020-11-05: 1.6 mL via INTRATHECAL

## 2020-11-05 MED ORDER — OXYTOCIN-SODIUM CHLORIDE 30-0.9 UT/500ML-% IV SOLN
INTRAVENOUS | Status: AC
  Filled 2020-11-05: qty 500

## 2020-11-05 SURGICAL SUPPLY — 39 items
BENZOIN TINCTURE PRP APPL 2/3 (GAUZE/BANDAGES/DRESSINGS) ×3 IMPLANT
CHLORAPREP W/TINT 26ML (MISCELLANEOUS) ×3 IMPLANT
CLAMP CORD UMBIL (MISCELLANEOUS) IMPLANT
CLOSURE WOUND 1/2 X4 (GAUZE/BANDAGES/DRESSINGS) ×1
CLOTH BEACON ORANGE TIMEOUT ST (SAFETY) ×3 IMPLANT
DERMABOND ADVANCED (GAUZE/BANDAGES/DRESSINGS)
DERMABOND ADVANCED .7 DNX12 (GAUZE/BANDAGES/DRESSINGS) IMPLANT
DRSG OPSITE POSTOP 4X10 (GAUZE/BANDAGES/DRESSINGS) ×3 IMPLANT
ELECT REM PT RETURN 9FT ADLT (ELECTROSURGICAL) ×3
ELECTRODE REM PT RTRN 9FT ADLT (ELECTROSURGICAL) ×1 IMPLANT
EXTRACTOR VACUUM KIWI (MISCELLANEOUS) IMPLANT
GLOVE BIOGEL PI IND STRL 7.0 (GLOVE) ×3 IMPLANT
GLOVE BIOGEL PI INDICATOR 7.0 (GLOVE) ×6
GLOVE ECLIPSE 6.5 STRL STRAW (GLOVE) ×3 IMPLANT
GOWN STRL REUS W/TWL LRG LVL3 (GOWN DISPOSABLE) ×9 IMPLANT
HEMOSTAT ARISTA ABSORB 3G PWDR (HEMOSTASIS) ×6 IMPLANT
KIT ABG SYR 3ML LUER SLIP (SYRINGE) IMPLANT
NEEDLE HYPO 25X5/8 SAFETYGLIDE (NEEDLE) IMPLANT
NS IRRIG 1000ML POUR BTL (IV SOLUTION) ×3 IMPLANT
PACK C SECTION WH (CUSTOM PROCEDURE TRAY) ×3 IMPLANT
PAD ABD 7.5X8 STRL (GAUZE/BANDAGES/DRESSINGS) ×3 IMPLANT
PAD OB MATERNITY 4.3X12.25 (PERSONAL CARE ITEMS) ×3 IMPLANT
PENCIL SMOKE EVAC W/HOLSTER (ELECTROSURGICAL) ×3 IMPLANT
RTRCTR C-SECT PINK 25CM LRG (MISCELLANEOUS) ×3 IMPLANT
SPONGE GAUZE 4X4 12PLY STER LF (GAUZE/BANDAGES/DRESSINGS) ×6 IMPLANT
STRIP CLOSURE SKIN 1/2X4 (GAUZE/BANDAGES/DRESSINGS) ×2 IMPLANT
SUT PLAIN 0 NONE (SUTURE) IMPLANT
SUT PLAIN 2 0 XLH (SUTURE) IMPLANT
SUT VIC AB 0 CT1 27 (SUTURE) ×6
SUT VIC AB 0 CT1 27XBRD ANBCTR (SUTURE) ×3 IMPLANT
SUT VIC AB 0 CTX 36 (SUTURE) ×6
SUT VIC AB 0 CTX36XBRD ANBCTRL (SUTURE) ×3 IMPLANT
SUT VIC AB 2-0 CT1 27 (SUTURE) ×4
SUT VIC AB 2-0 CT1 TAPERPNT 27 (SUTURE) ×2 IMPLANT
SUT VIC AB 4-0 KS 27 (SUTURE) ×3 IMPLANT
TAPE CLOTH SURG 4X10 WHT LF (GAUZE/BANDAGES/DRESSINGS) ×3 IMPLANT
TOWEL OR 17X24 6PK STRL BLUE (TOWEL DISPOSABLE) ×3 IMPLANT
TRAY FOLEY W/BAG SLVR 14FR LF (SET/KITS/TRAYS/PACK) IMPLANT
WATER STERILE IRR 1000ML POUR (IV SOLUTION) ×3 IMPLANT

## 2020-11-05 NOTE — Discharge Summary (Addendum)
Postpartum Discharge Summary  Date of Service updated: 11/08/20    Patient Name: Kayla Walker DOB: Jun 06, 1980 MRN: [id number]  Date of admission: 11/05/2020 Delivery date:11/05/2020  Delivering provider: Janyth Pupa  Date of discharge: 11/08/2020  Admitting diagnosis: Cesarean delivery delivered [O82] Intrauterine pregnancy: [redacted]w[redacted]d    Secondary diagnosis:  Principal Problem:   Cesarean delivery delivered Active Problems:   History of cesarean delivery   Uterine fibroids   Gestational diabetes mellitus, class A1   AMA (advanced maternal age) multigravida 35+   Limited prenatal care   Placental abruption in third trimester  Additional problems: as noted above  Discharge diagnosis: Cesarean delivery delivered, anemia, A1GDM                                      Post partum procedures: Venofer Augmentation: N/A Complications: Placental Abruption  Hospital course: Sceduled C/S   40y.o. yo G3P2103 at 370w0das admitted to the hospital on 11/05/2020 for concern of vaginal bleeding. Pt was promptly prepped for repeat Cesarean given persistent heavy port-wine appearing vaginal bleeding concerning for placental abruption. Delivery details are as follows:  Membrane Rupture Time/Date: 11:20 AM ,11/05/2020   Delivery Method:C-Section, Low Transverse  Details of operation can be found in separate operative note. Patient had an uncomplicated postpartum course.  She is ambulating, tolerating a regular diet, passing flatus, and urinating well. Patient is discharged home in stable condition on  11/08/20.        Newborn Data: Birth date:11/05/2020  Birth time:11:20 AM  Gender:Female  Living status:Living  Apgars:8 ,9  Weight:3025 g     Magnesium Sulfate received: No BMZ received: No Rhophylac:N/A MMGUY:QIHKVQQmmune T-DaP: declined Flu: N/A Transfusion:No  Physical exam  Vitals:   11/07/20 0609 11/07/20 1359 11/07/20 2048 11/08/20 0500  BP: 105/62 (!) 112/59 111/65 108/62  Pulse: 69  70 73 77  Resp: '18 16 17 18  ' Temp: 98.4 F (36.9 C) 98.8 F (37.1 C) 98.3 F (36.8 C) 98 F (36.7 C)  TempSrc: Oral Oral Oral Oral  SpO2: 100%     Weight:      Height:       General: alert, cooperative, and no distress Lochia: appropriate Uterine Fundus: firm Incision: Dressing is clean, dry, and intact DVT Evaluation: No evidence of DVT seen on physical exam. Negative Homan's sign. No cords or calf tenderness. Labs: Lab Results  Component Value Date   WBC 9.1 11/06/2020   HGB 9.0 (L) 11/06/2020   HCT 28.5 (L) 11/06/2020   MCV 85.1 11/06/2020   PLT 206 11/06/2020   CMP Latest Ref Rng & Units 11/05/2020  Glucose 70 - 99 mg/dL 83  BUN 6 - 20 mg/dL 6  Creatinine 0.44 - 1.00 mg/dL 0.30(L)  Sodium 135 - 145 mmol/L 137  Potassium 3.5 - 5.1 mmol/L 3.8  Chloride 98 - 111 mmol/L 104  CO2 22 - 32 mmol/L -  Calcium 8.9 - 10.3 mg/dL -  Total Protein 6.5 - 8.1 g/dL -  Total Bilirubin 0.3 - 1.2 mg/dL -  Alkaline Phos 38 - 126 U/L -  AST 15 - 41 U/L -  ALT 14 - 54 U/L -   Edinburgh Score: Edinburgh Postnatal Depression Scale Screening Tool 11/07/2020  I have been able to laugh and see the funny side of things. 1  I have looked forward with enjoyment to things. 0  I have  blamed myself unnecessarily when things went wrong. 2  I have been anxious or worried for no good reason. 0  I have felt scared or panicky for no good reason. 0  Things have been getting on top of me. 0  I have been so unhappy that I have had difficulty sleeping. 0  I have felt sad or miserable. 0  I have been so unhappy that I have been crying. 0  The thought of harming myself has occurred to me. 0  Edinburgh Postnatal Depression Scale Total 3     After visit meds:  Allergies as of 11/08/2020   No Known Allergies      Medication List     STOP taking these medications    Accu-Chek Guide Me w/Device Kit   Accu-Chek Guide test strip Generic drug: glucose blood   Accu-Chek Softclix Lancets  lancets   aspirin 81 MG EC tablet       TAKE these medications    acetaminophen 325 MG tablet Commonly known as: Tylenol Take 2 tablets (650 mg total) by mouth every 4 (four) hours as needed.   coconut oil Oil Apply 1 application topically as needed.   ferrous sulfate 325 (65 FE) MG tablet Take 1 tablet (325 mg total) by mouth every other day.   ibuprofen 600 MG tablet Commonly known as: ADVIL Take 1 tablet (600 mg total) by mouth every 6 (six) hours.   multivitamin-prenatal 27-0.8 MG Tabs tablet Take 1 tablet by mouth daily.   oxyCODONE 5 MG immediate release tablet Commonly known as: Oxy IR/ROXICODONE Take 1 tablet (5 mg total) by mouth every 4 (four) hours as needed for moderate pain.         Discharge home in stable condition Infant Feeding: Breast Infant Disposition:home with mother Discharge instruction: per After Visit Summary and Postpartum booklet. Activity: Advance as tolerated. Pelvic rest for 6 weeks.  Diet: routine diet Future Appointments: Future Appointments  Date Time Provider [place]  11/14/2020  8:30 AM Tresea Mall, CNM [facility]  12/09/2020 11:30 AM Janyth Pupa, DO [facility]   Follow up Visit: Message sent to [facility] by [facility].  Please schedule this patient for a In person postpartum visit in 6 weeks with the following provider: Any provider. Additional Postpartum F/U:2 hour GTT, incision check High risk pregnancy complicated by:  [id number], now h/o Cesarean x3, placental abruption Delivery mode:  C-Section, Low Transverse  Anticipated Birth Control:  Unsure   11/08/2020 Erskine Emery, PGY1  GME ATTESTATION:  I saw and evaluated the patient. I agree with the findings and the plan of care as documented in the resident's note.  Arrie Senate, MD OB Fellow, [place] for [place] 11/08/2020 8:02 PM

## 2020-11-05 NOTE — Op Note (Addendum)
Kayla Walker PROCEDURE DATE: 11/05/2020  PREOPERATIVE DIAGNOSES: Intrauterine pregnancy at [redacted]w[redacted]d weeks gestation;  placental abruption  POSTOPERATIVE DIAGNOSES: The same  PROCEDURE: Repeat Low Transverse Cesarean Section  SURGEON:  Dr. Janyth Pupa, MD  ASSISTANT:  Dr. Guillermina City, MD   Dr. Gifford Shave, MD  ANESTHESIOLOGY TEAM: Anesthesiologist: Catalina Gravel, MD CRNA: Rayvon Char, CRNA  INDICATIONS: Kayla Walker is a 40 y.o. [phone] at [redacted]w[redacted]d here for cesarean section secondary to the indications listed under preoperative diagnoses; please see preoperative note for further details.  The risks of surgery were discussed with the patient including but were not limited to: bleeding which may require transfusion or reoperation; infection which may require antibiotics; injury to bowel, bladder, ureters or other surrounding organs; injury to the fetus; need for additional procedures including hysterectomy in the event of a life-threatening hemorrhage; formation of adhesions; placental abnormalities wth subsequent pregnancies; incisional problems; thromboembolic phenomenon and other postoperative/anesthesia complications.  The patient concurred with the proposed plan, giving informed written consent for the procedure.    FINDINGS:  Viable female infant in cephalic presentation.  Apgars 8 and 9.  Meconium-stained amniotic fluid.  Upon entry to uterus, detached placenta with large volume port wine-appearing blood. Three vessel cord.  Normal uterus, fallopian tubes and ovaries bilaterally. Moderate adhesions present.  ANESTHESIA: Spinal INTRAVENOUS FLUIDS: 3,000 ml   ESTIMATED BLOOD LOSS: 1,088 ml URINE OUTPUT:  100 ml SPECIMENS: Placenta sent to pathology COMPLICATIONS: None immediate  PROCEDURE IN DETAIL:  The patient preoperatively received intravenous antibiotics and had sequential compression devices applied to her lower extremities.  She was then taken to the operating room  where spinal anesthesia was administered and was found to be adequate. She was then placed in a dorsal supine position with a leftward tilt, and prepped and draped in a sterile manner.  A foley catheter was placed into her bladder and attached to constant gravity.  After an adequate timeout was performed, a Pfannenstiel skin incision was made with scalpel along her preexisting scar and carried through to the underlying layer of fascia with use of knife and bovie. The fascia was incised in the midline, and this incision was extended bilaterally using the Mayo scissors.  Kocher clamps were applied to the superior aspect of the fascial incision and the underlying rectus muscles and moderate adhesions were dissected off bluntly and sharply.  A similar process was carried out on the inferior aspect of the fascial incision. The rectus muscles were separated in the midline and the peritoneum was entered bluntly. The Alexis self-retaining retractor was introduced into the abdominal cavity.  A small omental adhesion was noted that was clamped, cut and suture ligated.  Attention was turned to the lower uterine segment where a low transverse hysterotomy was made with a scalpel and extended bilaterally bluntly. Port-red wine stained blood was noted consistent with abruption. The infant was successfully delivered, the cord was clamped and cut immediately given findings of placental abruption. The infant was immediately handed over to the awaiting neonatology team. Uterine massage was then administered, and the placenta delivered (anterior and already detached upon entry to uterus) with a three-vessel cord. The uterus was then cleared of clots and debris.  The hysterotomy was closed with 0 Vicryl in a running locked fashion, and an imbricating layer was also placed with 0 Vicryl.  Several figure-of-eight 0 Vicryl serosal stitches were placed to help with hemostasis.  The pelvis was cleared of all clot and debris. Hemostasis was  confirmed on all  surfaces.  The retractor was removed. The fascia was then closed using 0 Vicryl in a running fashion.  The subcutaneous layer was irrigated, re-approximated with 2-0 plain gut interrupted stitches, and the skin was closed with a 4-0 Vicryl subcuticular stitch. The patient tolerated the procedure well. Sponge, instrument and needle counts were correct x 3.  She was taken to the recovery room in stable condition.   Randa Ngo, MD OB Fellow, [facility] 11/05/2020 12:24 PM  Attestation of Attending Supervision of Obstetric Fellow: Evaluation and management procedures were performed by the Obstetric Fellow under my supervision and collaboration.  I have reviewed the Obstetric Fellow's note and chart, and I agree with the management and plan. I have also made any necessary editorial changes.   Annalee Genta, DO Attending [place], [facility] for [facility], [facility] 11/05/2020 10:47 PM

## 2020-11-05 NOTE — Anesthesia Preprocedure Evaluation (Signed)
Anesthesia Evaluation  Patient identified by MRN, date of birth, ID band Patient awake    Reviewed: Allergy & Precautions, NPO status , Patient's Chart, lab work & pertinent test results  Airway Mallampati: II  TM Distance: >3 FB Neck ROM: Full    Dental  (+) Teeth Intact, Dental Advisory Given   Pulmonary neg pulmonary ROS,    Pulmonary exam normal breath sounds clear to auscultation       Cardiovascular negative cardio ROS Normal cardiovascular exam Rhythm:Regular Rate:Normal     Neuro/Psych negative neurological ROS     GI/Hepatic negative GI ROS, Neg liver ROS,   Endo/Other  diabetes, Well Controlled, GestationalObesity   Renal/GU negative Renal ROS     Musculoskeletal negative musculoskeletal ROS (+)   Abdominal   Peds  Hematology  (+) Blood dyscrasia, anemia ,   Anesthesia Other Findings Day of surgery medications reviewed with the patient.  Reproductive/Obstetrics (+) Pregnancy H/o C-section x2  Came to MAU for heavy vaginal bleeding x1 day                             Anesthesia Physical Anesthesia Plan  ASA: 3 and emergent  Anesthesia Plan: Spinal   Post-op Pain Management:    Induction:   PONV Risk Score and Plan: 2 and Scopolamine patch - Pre-op, Dexamethasone and Ondansetron  Airway Management Planned: Natural Airway  Additional Equipment:   Intra-op Plan:   Post-operative Plan:   Informed Consent: I have reviewed the patients History and Physical, chart, labs and discussed the procedure including the risks, benefits and alternatives for the proposed anesthesia with the patient or authorized representative who has indicated his/her understanding and acceptance.     Dental advisory given  Plan Discussed with: CRNA, Anesthesiologist and Surgeon  Anesthesia Plan Comments: (Discussed risks and benefits of and differences between spinal and general. Discussed  risks of spinal including headache, backache, failure, bleeding, infection, and nerve damage. Patient consents to spinal. Questions answered. Coagulation studies and platelet count acceptable.)        Anesthesia Quick Evaluation

## 2020-11-05 NOTE — ED Notes (Signed)
Pt not able to sign MSE.  Pt is severe pain.

## 2020-11-05 NOTE — Progress Notes (Signed)
Spoke with Katrinka Blazing RNC who has taken a call from [place] concerning a G3P2 at [redacted] weeks gestation who is complaining of lower abd pain and vaginal bleeding like a period. They have obtained a FHR of 134 BPM. Pt had a n ant low lying placenta that has resolved.

## 2020-11-05 NOTE — Anesthesia Procedure Notes (Signed)
Spinal  Patient location during procedure: OR Start time: 11/05/2020 10:51 AM End time: 11/05/2020 10:54 AM Reason for block: surgical anesthesia Staffing Performed: anesthesiologist  Anesthesiologist: Catalina Gravel, MD Preanesthetic Checklist Completed: patient identified, IV checked, risks and benefits discussed, surgical consent, monitors and equipment checked, pre-op evaluation and timeout performed Spinal Block Patient position: sitting Prep: DuraPrep and site prepped and draped Patient monitoring: continuous pulse ox and blood pressure Approach: midline Location: L3-4 Injection technique: single-shot Needle Needle type: Pencan  Needle gauge: 24 G Assessment Sensory level: T6 Events: CSF return Additional Notes Functioning IV was confirmed and monitors were applied. Sterile prep and drape, including hand hygiene, mask and sterile gloves were used. The patient was positioned and the spine was prepped. The skin was anesthetized with lidocaine.  Free flow of clear CSF was obtained prior to injecting local anesthetic into the CSF.  The spinal needle aspirated freely following injection.  The needle was carefully withdrawn.  The patient tolerated the procedure well. Consent was obtained prior to procedure with all questions answered and concerns addressed. Risks including but not limited to bleeding, infection, nerve damage, paralysis, failed block, inadequate analgesia, allergic reaction, high spinal, itching and headache were discussed and the patient wished to proceed.   Hoy Morn, MD

## 2020-11-05 NOTE — ED Notes (Signed)
Rapid Response Nurse called and information given to [facility]. Patient on Toco monitor. Fetal HR 136. Cervix checked by Dr Roderic Palau. Patient at 1cm dilation.

## 2020-11-05 NOTE — H&P (Signed)
Obstetric Preoperative History and Physical  Kayla Walker is a 40 y.o. [id number] with IUP at 2w0dpresenting for worsening abdominal pain and vaginal bleeding.  [facility]  Notes ctx q 524m with red blood that started this am and has continued.  Pt examined in MAU, noted to have handful of vaginal bleeding on exam.  Reports good fetal movement, no leaking of fluid.  Cesarean Section Indication: prior C-section x 2, possible placental abruption  [facility]  Pregnancy complications or risks: Patient Active Problem List   Diagnosis Date Noted   AMA (advanced maternal age) multigravida 35+ 10/05/2020   Limited prenatal care 10/05/2020   History of gestational diabetes in prior pregnancy, currently pregnant 07/21/2020   Supervision of high risk pregnancy, antepartum 07/21/2020   Gestational diabetes mellitus, class A1 08/16/2016   Uterine fibroids 07/30/2016   History of cesarean delivery 04/24/2016   She plans to breastfeed She desires  unsure  for postpartum contraception.   Prenatal labs and studies: ABO, Rh: --/--/PENDING (07/16 090349Antibody: PENDING (07/16 091791Rubella: 2.68 (03/31 1212) RPR: Reactive (06/17 0843)  HBsAg: Negative (03/31 1212)  HIV: Non Reactive (06/17 0843)  GBS: positive 1 hr Glucola  abnormal Genetic screening normal Anatomy USK[place]ormal  Prenatal Transfer Tool  Maternal Diabetes: Yes:  Diabetes Type:  Diet controlled Genetic Screening: Declined- late PNMoline Acresaternal Ultrasounds/Referrals: Normal Fetal Ultrasounds or other Referrals:  None Maternal Substance Abuse:  No Significant Maternal Medications:  None Significant Maternal Lab Results: Group B Strep positive  Past Medical History:  Diagnosis Date   Gestational diabetes    diet controlled   Medical history non-contributory     Past Surgical History:  Procedure Laterality Date   CESAREAN SECTION     CESAREAN SECTION N/A  10/26/2016   Procedure: CESAREAN SECTION;  Surgeon: ErChancy MilroyMD;  [facility];  Laterality: N/A;    OB History  Gravida Para Term Preterm AB Living  '3 2 2     2  ' SAB IAB Ectopic Multiple Live Births        0 2    # Outcome Date GA Lbr Len/2nd Weight Sex Delivery Anes PTL Lv  3 Current           2 Term 10/26/16 [id number] g F CS-LTranv Spinal N LIV     Complications: Gestational diabetes  1 Term 08/24/15 41w34w0d56 g F CS-LTranv EPI N LIV     Complications: Failure to Progress in Second Stage    Social History   Socioeconomic History   Marital status: Married    Spouse name: Not on file   Number of children: Not on file   Years of education: Not on file   Highest education level: Not on file  Occupational History   Not on file  Tobacco Use   Smoking status: Never   Smokeless tobacco: Never  Vaping Use   Vaping Use: Never used  Substance and Sexual Activity   Alcohol use: No   Drug use: No   Sexual activity: Yes    Birth control/protection: None  Other Topics Concern   Not on file  Social History Narrative   Not on file   Social Determinants of Health   Financial Resource Strain: Low Risk    Difficulty of Paying Living Expenses: Not very hard  Food Insecurity: No Food Insecurity   Worried About  Running Out of Food in the Last Year: Never true   Ran Out of Food in the Last Year: Never true  Transportation Needs: No Transportation Needs   Lack of Transportation (Medical): No   Lack of Transportation (Non-Medical): No  Physical Activity: Insufficiently Active   Days of Exercise per Week: 1 day   Minutes of Exercise per Session: 40 min  Stress: No Stress Concern Present   Feeling of Stress : Only a little  Social Connections: Moderately Integrated   Frequency of Communication with Friends and Family: More than three times a week   Frequency of Social Gatherings with Friends and Family: Once a week   Attends  Religious Services: More than 4 times per year   Active Member of [employer] or Organizations: No   Attends Music therapist: Never   Marital Status: Married    Family History  Problem Relation Age of Onset   Autism Daughter    Alcohol abuse Neg Hx    Arthritis Neg Hx    Asthma Neg Hx    Birth defects Neg Hx    Cancer Neg Hx    COPD Neg Hx    Depression Neg Hx    Diabetes Neg Hx    Drug abuse Neg Hx    Early death Neg Hx    Hearing loss Neg Hx    Hyperlipidemia Neg Hx    Heart disease Neg Hx    Hypertension Neg Hx    Kidney disease Neg Hx    Learning disabilities Neg Hx    Mental illness Neg Hx    Mental retardation Neg Hx    Miscarriages / Stillbirths Neg Hx    Stroke Neg Hx    Vision loss Neg Hx    Varicose Veins Neg Hx     Medications Prior to Admission  Medication Sig Dispense Refill Last Dose   Accu-Chek Softclix Lancets lancets Check 4 times daily 100 each 12    aspirin 81 MG EC tablet Take 1 tablet (81 mg total) by mouth daily. Swallow whole. 90 tablet 3    Blood Glucose Monitoring Suppl (ACCU-CHEK GUIDE ME) w/Device KIT 1 kit by Does not apply route in the morning, at noon, in the evening, and at bedtime. 1 kit 0    glucose blood (ACCU-CHEK GUIDE) test strip Check 4 times daily 50 each 12    Prenatal Vit-Fe Fumarate-FA (MULTIVITAMIN-PRENATAL) 27-0.8 MG TABS tablet Take 1 tablet by mouth daily.        No Known Allergies  Review of Systems: Pertinent items noted in HPI and remainder of comprehensive ROS otherwise negative.  Physical Exam: BP 121/88 (BP Location: Left Arm)   Pulse 80   Temp 98.3 F (36.8 C) (Oral)   Resp 20   Ht 5' (1.524 m)   Wt 86.2 kg   LMP 03/05/2020   SpO2 98%   BMI 37.11 kg/m  FHR by Doppler: 140 bpm CONSTITUTIONAL: Well-developed, well-nourished female mild distress EYES: Conjunctivae and EOM are normal. Pupils are equal, round, and reactive to light. No scleral icterus.  SKIN: Skin is warm and dry. No rash noted. Not  diaphoretic. No erythema. No pallor. [place]: Alert and oriented to person, place, and time. PSYCHIATRIC: Normal mood and affect. Normal behavior. Normal judgment and thought content. CARDIOVASCULAR: Normal heart rate noted, regular rhythm RESPIRATORY: CTAB, Effort and breath sounds normal, no problems with respiration noted ABDOMEN: Soft, nontender, nondistended, gravid. Well-healed Pfannenstiel incision. PELVIC: on speculum exam- dark red blood  noted in vault with bleeding noted from cervix, on bimanual cervix ~ 1cm MUSCULOSKELETAL: No edema and no tenderness.   Pertinent Labs/Studies:   Results for orders placed or performed during the hospital encounter of 11/05/20 (from the past 72 hour(s))  I-stat chem 8, ED (not at [facility] or [facility])     Status: Abnormal   Collection Time: 11/05/20  8:49 AM  Result Value Ref Range   Sodium 137 135 - 145 mmol/L   Potassium 3.8 3.5 - 5.1 mmol/L   Chloride 104 98 - 111 mmol/L   BUN 6 6 - 20 mg/dL   Creatinine, Ser 0.30 (L) 0.44 - 1.00 mg/dL   Glucose, Bld 83 70 - 99 mg/dL    Comment: Glucose reference range applies only to samples taken after fasting for at least 8 hours.   Calcium, Ion 1.15 1.15 - 1.40 mmol/L   TCO2 22 22 - 32 mmol/L   Hemoglobin 14.3 12.0 - 15.0 g/dL   HCT 42.0 36.0 - 46.0 %  CBC     Status: Abnormal   Collection Time: 11/05/20  9:46 AM  Result Value Ref Range   WBC 8.1 4.0 - 10.5 K/uL   RBC 4.41 3.87 - 5.11 MIL/uL   Hemoglobin 11.8 (L) 12.0 - 15.0 g/dL   HCT 37.0 36.0 - 46.0 %   MCV 83.9 80.0 - 100.0 fL   MCH 26.8 26.0 - 34.0 pg   MCHC 31.9 30.0 - 36.0 g/dL   RDW 15.2 11.5 - 15.5 %   Platelets 230 150 - 400 K/uL   nRBC 0.0 0.0 - 0.2 %    Comment: Performed at [facility], [place] [address] [place], [place] [address]  Type and screen [place]     Status: None (Preliminary result)   Collection Time: 11/05/20  9:46 AM  Result Value Ref Range   ABO/RH(D) PENDING    Antibody Screen PENDING    Sample  Expiration      11/08/2020,2359 Performed at [facility], [place] [address] [place], [place] [address]     Assessment and Plan: Kayla Walker is a 40 y.o. G3P2002 at 47w0dbeing admitted for cesarean section due to prior C-section and concern for possible abruption. The risks of surgery were discussed with the patient including but were not limited to: bleeding which may require transfusion or reoperation; infection which may require antibiotics; injury to bowel, bladder, ureters or other surrounding organs; injury to the fetus; need for additional procedures including hysterectomy in the event of a life-threatening hemorrhage; formation of adhesions; placental abnormalities wth subsequent pregnancies; incisional problems; thromboembolic phenomenon and other postoperative/anesthesia complications. The patient concurred with the proposed plan, giving informed written consent for the procedure. Patient has been NPO since last night she will remain NPO for procedure. Anesthesia and OR aware. Preoperative prophylactic antibiotics and SCDs ordered on call to the OR. To OR when ready.   JOtis Peak DO OCole[facility] Centerfor WDean Foods Company C[place]

## 2020-11-05 NOTE — ED Notes (Signed)
Called [facility] for transport to MAU at [facility].

## 2020-11-05 NOTE — Anesthesia Postprocedure Evaluation (Signed)
Anesthesia Post Note  Patient: Kayla Walker  Procedure(s) Performed: CESAREAN SECTION     Patient location during evaluation: PACU Anesthesia Type: Spinal Level of consciousness: oriented, awake and alert and awake Pain management: pain level controlled Vital Signs Assessment: post-procedure vital signs reviewed and stable Respiratory status: spontaneous breathing, respiratory function stable and nonlabored ventilation Cardiovascular status: blood pressure returned to baseline and stable Postop Assessment: no headache, no backache, no apparent nausea or vomiting, patient able to bend at knees and spinal receding Anesthetic complications: no   No notable events documented.  Last Vitals:  Vitals:   11/05/20 1355 11/05/20 1439  BP: (!) 105/59 (!) 100/57  Pulse: 61 61  Resp: 17 18  Temp: 36.5 C 36.6 C  SpO2:  98%    Last Pain:  Vitals:   11/05/20 1439  TempSrc: Oral  PainSc:    Pain Goal: Patients Stated Pain Goal: 0 (11/05/20 0924)                 Catalina Gravel

## 2020-11-05 NOTE — Progress Notes (Signed)
Pt is being transferred to MAU at [facility] at [facility].

## 2020-11-05 NOTE — ED Provider Notes (Signed)
[facility] EMERGENCY DEPARTMENT Provider Note   CSN: [id number] Arrival date & time: 11/05/20  0923     History Chief Complaint  Patient presents with   Vaginal Bleeding    Kayla Walker is a 40 y.o. female.  Patient is [redacted] weeks pregnant.  She started with severe lower abdominal pain and bleeding today about an hour ago.  The history is provided by the patient and medical records. No language interpreter was used.  Vaginal Bleeding Quality:  Bright red and clots Severity:  Moderate Onset quality:  Sudden Duration: 1 hour. Timing:  Constant Progression:  Waxing and waning Chronicity:  New Associated symptoms: no abdominal pain, no back pain and no fatigue       Past Medical History:  Diagnosis Date   Gestational diabetes    diet controlled   Medical history non-contributory     Patient Active Problem List   Diagnosis Date Noted   AMA (advanced maternal age) multigravida 35+ 10/05/2020   Limited prenatal care 10/05/2020   History of gestational diabetes in prior pregnancy, currently pregnant 07/21/2020   Supervision of high risk pregnancy, antepartum 07/21/2020   Gestational diabetes mellitus, class A1 08/16/2016   Uterine fibroids 07/30/2016   History of cesarean delivery 04/24/2016    Past Surgical History:  Procedure Laterality Date   CESAREAN SECTION     CESAREAN SECTION N/A 10/26/2016   Procedure: CESAREAN SECTION;  Surgeon: Chancy Milroy, MD;  Location: [place];  Service: Obstetrics;  Laterality: N/A;     OB History     Gravida  3   Para  2   Term  2   Preterm      AB      Living  2      SAB      IAB      Ectopic      Multiple  0   Live Births  2           Family History  Problem Relation Age of Onset   Autism Daughter    Alcohol abuse Neg Hx    Arthritis Neg Hx    Asthma Neg Hx    Birth defects Neg Hx    Cancer Neg Hx    COPD Neg Hx    Depression Neg Hx    Diabetes Neg Hx    Drug abuse Neg Hx    Early  death Neg Hx    Hearing loss Neg Hx    Hyperlipidemia Neg Hx    Heart disease Neg Hx    Hypertension Neg Hx    Kidney disease Neg Hx    Learning disabilities Neg Hx    Mental illness Neg Hx    Mental retardation Neg Hx    Miscarriages / Stillbirths Neg Hx    Stroke Neg Hx    Vision loss Neg Hx    Varicose Veins Neg Hx     Social History   Tobacco Use   Smoking status: Never   Smokeless tobacco: Never  Vaping Use   Vaping Use: Never used  Substance Use Topics   Alcohol use: No   Drug use: No    Home Medications Prior to Admission medications   Medication Sig Start Date End Date Taking? Authorizing Provider  Accu-Chek Softclix Lancets lancets Check 4 times daily 10/10/20   Roma Schanz, CNM  aspirin 81 MG EC tablet Take 1 tablet (81 mg total) by mouth daily. Swallow whole. 07/21/20   Gertie Exon,  Royetta Crochet, CNM  Blood Glucose Monitoring Suppl (ACCU-CHEK GUIDE ME) w/Device KIT 1 kit by Does not apply route in the morning, at noon, in the evening, and at bedtime. 10/10/20 11/09/20  Roma Schanz, CNM  glucose blood (ACCU-CHEK GUIDE) test strip Check 4 times daily 10/10/20   Roma Schanz, CNM  Prenatal Vit-Fe Fumarate-FA (MULTIVITAMIN-PRENATAL) 27-0.8 MG TABS tablet Take 1 tablet by mouth daily.     [provider]    Allergies    Patient has no known allergies.  Review of Systems   Review of Systems  Constitutional:  Negative for appetite change and fatigue.  HENT:  Negative for congestion, ear discharge and sinus pressure.   Eyes:  Negative for discharge.  Respiratory:  Negative for cough.   Cardiovascular:  Negative for chest pain.  Gastrointestinal:  Negative for abdominal pain and diarrhea.  Genitourinary:  Positive for vaginal bleeding. Negative for frequency and hematuria.  Musculoskeletal:  Negative for back pain.  Skin:  Negative for rash.  Neurological:  Negative for seizures and headaches.  Psychiatric/Behavioral:  Negative for  hallucinations.    Physical Exam Updated Vital Signs BP 121/79   Pulse 88   Temp 98.3 F (36.8 C) (Oral)   Resp 20   Ht 5' (1.524 m)   Wt 86.2 kg   LMP 03/05/2020   SpO2 99%   BMI 37.11 kg/m   Physical Exam Vitals and nursing note reviewed.  Constitutional:      Appearance: She is well-developed.  HENT:     Head: Normocephalic.     Nose: Nose normal.  Eyes:     General: No scleral icterus.    Conjunctiva/sclera: Conjunctivae normal.  Neck:     Thyroid: No thyromegaly.  Cardiovascular:     Rate and Rhythm: Normal rate and regular rhythm.     Heart sounds: No murmur heard.   No friction rub. No gallop.  Pulmonary:     Breath sounds: No stridor. No wheezing or rales.  Chest:     Chest wall: No tenderness.  Abdominal:     General: There is no distension.     Tenderness: There is no abdominal tenderness. There is no rebound.  Genitourinary:    Comments: Patient with vaginal bleeding with bright red blood and tenderness suprapubic.  She is obviously [redacted] weeks pregnant with fetal heart tones of 150.  Bimanual exam done and patient is 1 cm dilated with mild to moderate bleeding Musculoskeletal:        General: Normal range of motion.     Cervical back: Neck supple.  Lymphadenopathy:     Cervical: No cervical adenopathy.  Skin:    Findings: No erythema or rash.  Neurological:     Mental Status: She is alert and oriented to person, place, and time.     Motor: No abnormal muscle tone.     Coordination: Coordination normal.  Psychiatric:        Behavior: Behavior normal.    ED Results / Procedures / Treatments   Labs (all labs ordered are listed, but only abnormal results are displayed) Labs Reviewed  I-STAT CHEM 8, ED    EKG None  Radiology No results found.  Procedures Procedures   Medications Ordered in ED Medications  sodium chloride 0.9 % bolus 500 mL (has no administration in time range)    ED Course  I have reviewed the triage vital signs and  the nursing notes.  Pertinent labs & imaging results that were  available during my care of the patient were reviewed by me and considered in my medical decision making (see chart for details).   CRITICAL CARE Performed by: Milton Ferguson Total critical care time: 35 minutes Critical care time was exclusive of separately billable procedures and treating other patients. Critical care was necessary to treat or prevent imminent or life-threatening deterioration. Critical care was time spent personally by me on the following activities: development of treatment plan with patient and/or surrogate as well as nursing, discussions with consultants, evaluation of patient's response to treatment, examination of patient, obtaining history from patient or surrogate, ordering and performing treatments and interventions, ordering and review of laboratory studies, ordering and review of radiographic studies, pulse oximetry and re-evaluation of patient's condition.  MDM Rules/Calculators/A&P                          Patient with vaginal bleeding and [redacted] weeks pregnant.  I spoke with Dr.ozan and she stated to transfer the patient immediately to [facility], MAU. Final Clinical Impression(s) / ED Diagnoses Final diagnoses:  None    Rx / DC Orders ED Discharge Orders     None        Milton Ferguson, MD 11/05/20 [phone]

## 2020-11-05 NOTE — MAU Provider Note (Signed)
History     CSN: [id number]  Arrival date and time: 11/05/20 0739    Chief Complaint  Patient presents with   Vaginal Bleeding   Contractions   HPI Kayla Walker is a 40 y.o. G3P2002 at 24w0dwho [facility] and vaginal bleeding. Patient endorses one week history of abdominal pain. She is s/p evaluation in MAU a little more than twenty-four hours ago. Patient states her pain returned and intensified first thing this morning. Pain score on arrival to MAU is 10/10. Patient denies aggravating or alleviating factors.  Patient's vaginal bleeding is new. Onset this morning "at sunrise". Patient is unsure of time. She states her bleeding is heavy and continuous "like my period".  Patient is remote from sexual intercourse. She denies vaginal bleeding decreased fetal movement, fever, falls, or recent illness.   Patient receives [facility] care with [name]  Hx c/s x 2, previously planned TOLAC. Patient has been NPO since last night  OB History     Gravida  3   Para  2   Term  2   Preterm      AB      Living  2      SAB      IAB      Ectopic      Multiple  0   Live Births  2           Past Medical History:  Diagnosis Date   Gestational diabetes    diet controlled   Medical history non-contributory     Past Surgical History:  Procedure Laterality Date   CESAREAN SECTION     CESAREAN SECTION N/A 10/26/2016   Procedure: CESAREAN SECTION;  Surgeon: [name];  [place]  Service: Obstetrics;  Laterality: N/A;    Family History  Problem Relation Age of Onset   Autism Daughter    Alcohol abuse Neg Hx    Arthritis Neg Hx    Asthma Neg Hx    Birth defects Neg Hx    Cancer Neg Hx    COPD Neg Hx    Depression Neg Hx    Diabetes Neg Hx    Drug abuse Neg Hx    Early death Neg Hx    Hearing loss Neg Hx    Hyperlipidemia Neg Hx    Heart disease Neg  Hx    Hypertension Neg Hx    Kidney disease Neg Hx    Learning disabilities Neg Hx    Mental illness Neg Hx    Mental retardation Neg Hx    Miscarriages / Stillbirths Neg Hx    Stroke Neg Hx    Vision loss Neg Hx    Varicose Veins Neg Hx     Social History   Tobacco Use   Smoking status: Never   Smokeless tobacco: Never  Vaping Use   Vaping Use: Never used  Substance Use Topics   Alcohol use: No   Drug use: No    Allergies: No Known Allergies  Medications Prior to Admission  Medication Sig Dispense Refill Last Dose   Accu-Chek Softclix Lancets lancets Check 4 times daily 100 each 12    aspirin 81 MG EC tablet Take 1 tablet (81 mg total) by mouth daily. Swallow whole. 90 tablet 3    Blood Glucose Monitoring Suppl (ACCU-CHEK GUIDE ME) w/Device KIT 1 kit by Does not apply route in  the morning, at noon, in the evening, and at bedtime. 1 kit 0    glucose blood (ACCU-CHEK GUIDE) test strip Check 4 times daily 50 each 12    Prenatal Vit-Fe Fumarate-FA (MULTIVITAMIN-PRENATAL) 27-0.8 MG TABS tablet Take 1 tablet by mouth daily.        Review of Systems  Gastrointestinal:  Positive for abdominal pain.  Genitourinary:  Positive for vaginal bleeding.  All other systems reviewed and are negative. Physical Exam   Blood pressure 121/88, pulse 80, temperature 98.3 F (36.8 C), temperature source Oral, resp. rate 20, height 5' (1.524 m), weight 86.2 kg, last menstrual period 03/05/2020, SpO2 98 %.  Physical Exam Vitals and nursing note reviewed. Exam conducted with a chaperone present.  Constitutional:      General: She is in acute distress.     Appearance: Normal appearance. She is ill-appearing.  Cardiovascular:     Rate and Rhythm: Normal rate.     Pulses: Normal pulses.  Pulmonary:     Effort: Pulmonary effort is normal.     Breath sounds: Normal breath sounds.  Abdominal:     Tenderness: There is abdominal tenderness.     Comments: Gravid, TTP but not exquisitely tender   Skin:    Capillary Refill: Capillary refill takes less than 2 seconds.  Neurological:     Mental Status: She is alert and oriented to person, place, and time.  Psychiatric:        Mood and Affect: Mood normal.        Behavior: Behavior normal.        Thought Content: Thought content normal.        Judgment: Judgment normal.    MAU Course  Procedures  --Patient met by CNM on arrival to MAU.  Patient crying with ctx. Dark red blood visible on perineal --Cervix remains 1 very long very posterior. CNMs palm of glove filled with dark red blood following exam. --Concern for abruption. Will prep for OR, update provided to Dr. [name]. Will give Terb. Dr. [name] inbound to bedside --Overall reassuring fetal surveillance but nonreactive: baseline 140, mod var, no accels, no decels --Toco:  Irregular q 2-5 min prior to Terb  Orders Placed This Encounter  Procedures   CBC   RPR   Place and maintain sequential compression device   I-stat chem 8, ED (not at [facility] or [facility])   Type and screen [place] peripheral IV   Patient Vitals for the past 24 hrs:  BP Temp Temp src Pulse Resp SpO2 Height Weight  11/05/20 0922 121/88 98.3 F (36.8 C) Oral 80 20 98 % -- --  11/05/20 0830 126/70 -- -- 87 (!) 29 99 % -- --  11/05/20 0815 -- -- -- 78 (!) 31 97 % -- --  11/05/20 0753 -- -- -- -- 20 -- 5' (1.524 m) 86.2 kg  11/05/20 0751 121/79 98.3 F (36.8 C) Oral 88 -- 99 % -- --   Assessment and Plan  --40 y.o. G3P2002 at [redacted]w[redacted]d --Prolonged moderate dark red vaginal bleeding --Abdominal pain 10/10, improving s/p Terb --Hgb decreased from 14.3-11.8 in one hour --Patient NPO since last night --Per Dr. ONelda Marseille prep for OR, concern for placental abruption in progress  SDarlina Rumpf CNM 11/05/2020, 11:37 AM

## 2020-11-05 NOTE — Transfer of Care (Signed)
Immediate Anesthesia Transfer of Care Note  Patient: Kayla Walker  Procedure(s) Performed: CESAREAN SECTION  Patient Location: PACU  Anesthesia Type:Spinal  Level of Consciousness: awake, alert  and oriented  Airway & Oxygen Therapy: Patient Spontanous Breathing  Post-op Assessment: Report given to RN and Post -op Vital signs reviewed and stable  Post vital signs: Reviewed and stable  Last Vitals:  Vitals Value Taken Time  BP    Temp    Pulse 73 11/05/20 1236  Resp 19 11/05/20 1236  SpO2 93 % 11/05/20 1236  Vitals shown include unvalidated device data.  Last Pain:  Vitals:   11/05/20 0924  TempSrc:   PainSc: 9       Patients Stated Pain Goal: 0 (07/37/10 6269)  Complications: No notable events documented.

## 2020-11-05 NOTE — Lactation Note (Signed)
This note was copied from a baby's chart. Lactation Consultation Note  Patient Name: Kayla Walker Today's Date: 11/05/2020 Reason for consult: Initial assessment;Mother's request;1st time breastfeeding;Late-preterm 34-36.6wks;Maternal endocrine disorder Age:38 hours  Infant fed 7 ml of Similac 22 cal/oz 1 hr prior to [facility] arrival.   Mom flat nipples but will erect with stimulation. LC reviewed using manual pump 5-10 min or breast massage with nipple turn to bring out nipples before latching.  Mom call for assistance with latching stating her previous attempts infant hard time latching for more than 2 minutes since her nipples are flat.   [place] reviewed with Mother behavior of LPTI to reduce  calorie loss including keeping infant STS, keeping hat on all times, supplementing after each feeding EBM first followed by formula and keeping total feeding under 30 min.   Plan 1. To feed by cues 8-12x in 24 hr period no more than 3 hrs without an attempt. Mom to offer breast first STS and look for signs of milk transfer.  2. If drops of colostrum from pumping or hand expression, offer first with spoon feeding.  3. Mom to supplement based on LPTI 7-10 ml EBM first followed by formula paced bottle feeding with extra slow flow nipple.  4. I and O sheet reviewed.  5 LC brochure of inpatient and outpatient services reviewed.   All questions answered at the end of the visit.   Maternal Data Has patient been taught Hand Expression?: Yes Does the patient have breastfeeding experience prior to this delivery?: No  Feeding Mother's Current Feeding Choice: Breast Milk and Formula Nipple Type: Extra Slow Flow  LATCH Score                    Lactation Tools Discussed/Used Tools: Pump;Flanges Flange Size: 21 Breast pump type: Double-Electric Breast Pump Pump Education: Setup, frequency, and cleaning;Milk Storage Reason for Pumping: increase stimulation Pumping frequency: every 3 hrs for 15  min  Interventions Interventions: Breast feeding basics reviewed;Education;Position options;Skin to skin;Expressed milk;Breast massage;Hand express;Pre-pump if needed;DEBP;Breast compression  Discharge Pump: Manual WIC Program: Yes  Consult Status Consult Status: Follow-up Date: 11/06/20 Follow-up type: In-patient    Kayla Shewell  Walker 11/05/2020, 3:43 PM

## 2020-11-05 NOTE — MAU Note (Signed)
Pt reports to mau from [place] with c/o ctx and vag bleeding since early this morning. Denies LOF.

## 2020-11-06 LAB — CBC
HCT: 28.5 % — ABNORMAL LOW (ref 36.0–46.0)
Hemoglobin: 9 g/dL — ABNORMAL LOW (ref 12.0–15.0)
MCH: 26.9 pg (ref 26.0–34.0)
MCHC: 31.6 g/dL (ref 30.0–36.0)
MCV: 85.1 fL (ref 80.0–100.0)
Platelets: 206 10*3/uL (ref 150–400)
RBC: 3.35 MIL/uL — ABNORMAL LOW (ref 3.87–5.11)
RDW: 15.3 % (ref 11.5–15.5)
WBC: 9.1 10*3/uL (ref 4.0–10.5)
nRBC: 0 % (ref 0.0–0.2)

## 2020-11-06 LAB — GLUCOSE, CAPILLARY: Glucose-Capillary: 120 mg/dL — ABNORMAL HIGH (ref 70–99)

## 2020-11-06 LAB — RPR: RPR Ser Ql: NONREACTIVE

## 2020-11-06 MED ORDER — SODIUM CHLORIDE 0.9 % IV SOLN
500.0000 mg | Freq: Once | INTRAVENOUS | Status: AC
  Administered 2020-11-06: 500 mg via INTRAVENOUS
  Filled 2020-11-06: qty 25

## 2020-11-06 MED ORDER — LACTATED RINGERS IV BOLUS
500.0000 mL | Freq: Once | INTRAVENOUS | Status: AC
  Administered 2020-11-06: 500 mL via INTRAVENOUS

## 2020-11-06 NOTE — Progress Notes (Signed)
CSW acknowledges and has complete consult for MOB. CSW will update note when time permits. CSW identifies no further need for intervention or barriers to discharge at this time.   Darcus Austin, MSW, LCSW-A Clinical Social Worker [phone]

## 2020-11-06 NOTE — Progress Notes (Signed)
When I went in to do mom's 4 hour check and to remove foley, I got blood pressures of 89/40, 87/38, and 92/49 mom stated that when she changed positions she felt dizzy. I did not remove the foley, I gave her some juice and I called first call L&D. I spoke to [facility] and got orders to give pt a 500cc bolus. Pt has CBC in AM.    I checked BP at 0330 post bolus and got 98/52. I will continue to monitor

## 2020-11-06 NOTE — Clinical Social Work Maternal (Addendum)
CLINICAL SOCIAL WORK MATERNAL/CHILD NOTE  Patient Details  Name: Kayla Walker MRN: [id number] Date of Birth: [birth year]  Date:  11/06/2020  Clinical Social Worker Initiating Note:  Darcus Austin, MSW, LCSWA Date/Time: Initiated:  11/06/20/1358     Child's Name:  Randel Pigg   Biological Parents:  Mother, Father Samantha Crimes, 04/10/80, [phone])   Need for Interpreter:  None   Reason for Referral:  Late or No Prenatal Care   (Limited [facility]   Address:  [address] [place] [address]    Phone number:  [phone] (home)     Additional phone number: [phone] (FOB)  Household Members/Support Persons (HM/SP):   Household Member/Support Person 1, Household Member/Support Person 2, Household Member/Support Person 3   HM/SP Name Relationship DOB or Age  HM/SP -1 Abebe Bonge FOB 04/10/80  HM/SP -2 Ladona Ridgel Daughter 08/24/15  HM/SP -3 Emma Bonge Daughter 10/26/16  HM/SP -4        HM/SP -5        HM/SP -6        HM/SP -7        HM/SP -8          Natural Supports (not living in the home):  Spouse/significant other   Professional Supports: None   Employment: Unemployed   Type of Work:     Education:  Programmer, systems   Homebound arranged:    Museum/gallery curator Resources:  Medicaid   Other Resources:  [facility]   Cultural/Religious Considerations Which May Impact Care:    Strengths:  Ability to meet basic needs  , Pediatrician chosen   Psychotropic Medications:         Pediatrician:    [place] (including Technical sales engineer and surronding areas)  Pediatrician List:   [facility]      Pediatrician Fax Number:    Risk Factors/Current Problems:  Other (Comment) (Limited PNC)   Cognitive State:  Able to Concentrate  , Alert  , Insightful  , Linear Thinking  , Goal Oriented     Mood/Affect:  Interested  , Comfortable  , Calm  ,  Bright  , Happy  , Relaxed     CSW Assessment: CSW met with MOB to complete consult for limited prenatal care. CSW observed MOB resting in bed, and infant in bassinet. CSW explained role and reason for consult. MOB was pleasant, polite, and engaged with CSW. MOB reported, she was waiting to receive medicaid, before seeking care. Therefore, that is the reason for limited prenatal care.   MOB denied any illicit substances use or CPS involvement. CSW informed MOB of Drug Screen Policy and MOB was understanding of protocol. CSW will continue to follow the CDS and will make CPS report if warranted.   CSW provided education regarding the baby blues period vs. perinatal mood disorders, discussed treatment and gave resources for mental health follow up if concerns arise. CSW recommends self- evaluation during the postpartum time period using the New Mom Checklist from Postpartum Progress and encouraged MOB to contact a medical professional if symptoms are noted at any time.   When CSW asked MOB of her emotions since delivery. MOB reported, she feels, " okay". MOB reported, FOB is very supportive. MOB denied SI, HI, and DV when CSW assessed for safety.   MOB reported, she receive WIC, but does not receive food  stamps. MOB declined interest in food stamps services. MOB reported, infant's pediatrician will be at [place] and there are no barriers to follow up infant's care. MOB reported, she has all essentials needed to care for infant. MOB reported, infant has a car seat, bassinet, and crib. MOB denied any additional barriers.     CSW provided education on sudden infant death syndrome (SIDS).  CSW will continue to follow the CDS and will make CPS report if warranted.   CSW Plan/Description:  No Further Intervention Required/No Barriers to Discharge, Sudden Infant Death Syndrome (SIDS) Education, Perinatal Mood and Anxiety Disorder (PMADs) Education, [place],  CSW Will Continue to Monitor Umbilical Cord Tissue Drug Screen Results and Make Report if Wilhemena Durie 11/06/2020, 2:17 PM  Darcus Austin, MSW, LCSW-A Clinical Social Worker- Weekends [phone]

## 2020-11-06 NOTE — Progress Notes (Addendum)
Kayla Walker [id number] Postpartum Day One S/P Repeat Cesarean Section due to Placental Abruption  Subjective: Patient up ad lib.  Nurse called and patient with c/o dizziness and hypotension noted.  Patient given fluid bolus of 556mL LR and juice.  Reports consuming regular diet without issues and denies N/V. Patient reports that she has not had a bowel movement and is not passing flatus.  Denies issues with urination as foley remains in place.  Patient reports bleeding is "good."  Patient is breastfeeding and reports she has no milk yet.  Desires condoms for postpartum contraception.  Pain is being appropriately managed with use of tylenol.   Objective: Temp:  [97.7 F (36.5 C)-98.5 F (36.9 C)] 98 F (36.7 C) (07/17 0530) Pulse Rate:  [61-88] 65 (07/17 0530) Resp:  [15-31] 16 (07/17 0530) BP: (88-126)/(49-88) 88/49 (07/17 0530) SpO2:  [95 %-100 %] 95 % (07/17 0530) Weight:  [86.2 kg] 86.2 kg (07/16 0753)  Vitals:   11/05/20 2243 11/06/20 0243 11/06/20 0330 11/06/20 0530  BP: (!) 100/52 (!) 92/49 (!) 98/52 (!) 88/49  Pulse: 75 72  65  Resp: 16 18  16   Temp: 98 F (36.7 C) 98 F (36.7 C)  98 F (36.7 C)  TempSrc: Oral   Oral  SpO2: 98%   95%  Weight:      Height:         Recent Labs    11/05/20 0849 11/05/20 0946 11/06/20 0417  HGB 14.3 11.8* 9.0*  HCT 42.0 37.0 28.5*  WBC  --  8.1 9.1    Physical Exam:  General: alert, cooperative, fatigued, and no distress Mood/Affect: Appropriate/Appropriate Lungs: clear to auscultation, no wheezes, rales or rhonchi, symmetric air entry.  Heart: normal rate and regular rhythm. Breast: Soft, Nontender. Abdomen:  + bowel sounds, Appropriately Tender Incision: Pressure dressing in place  Uterine Fundus: firm at U/-1 Lochia: appropriate Skin: Warm, Dry. DVT Evaluation: No evidence of DVT seen on physical exam. SCDs remain in place JP drain:   None  Assessment Post Operative Day One S/P Repeat C/S Normal  [employer] BreastFeeding Symptomatic Anemia GDM  Plan: -Patient with run of hypotension with dizziness and is s/p 530mL bolus. -HgB returns at 9.0 which is significant decrease. -Reviewed Venofer infusion for iron replacement. -R/B discussed and patient without questions. -Informed that drains and IVs likely to be removed this evening/tonight after completion of iron infusion and improvement in BP and dizziness. -Encouraged continued usage of medication for pain management.  -Patient fasting BS 120, but nurse reports patient given juice to help combat bout of dizziness earlier. -Will plan for and order repeat fasting CBG tomorrow.  -Continue other mgmt as ordered -L&D to be updated on patient status  Maryann Conners MSN, CNM 11/06/2020, 6:04 AM

## 2020-11-06 NOTE — Progress Notes (Signed)
Dr. Astrid Drafts Called to ask if okay to remove foley now patient feels better and iron infusion done

## 2020-11-06 NOTE — Lactation Note (Addendum)
This note was copied from a baby's chart. Lactation Consultation Note  Patient Name: Kayla Walker Today's Date: 11/06/2020 Reason for consult: Follow-up assessment;Late-preterm 34-36.6wks;Maternal endocrine disorder EBL 1088  Age:38 hours 2% weight loss  Baby is exclusively bottle feeding 22 cal formula. P3 Mom states her babies doesn't like her breast and she has little milk.  She doesn't want a WIC pump because she wants the formula package.  Mom states she ordered her own pump.    Mom has not pumped yet today.  Talked about the importance of consistent pumping to support a full milk supply.    LC noted pump parts soaking in water.  Educated Mom on importance of disassembling all pump parts, washing in warm water, rinsing and air drying in separate bin provided.  LC washed all the parts and assisted Mom to pump for first time today, changed to a 24 mm flange.   Encouraged STS with baby as much as possible to help stimulate milk production.  Mom to ask for assistance with latching.  Mom has short nipple shafts but areola very compressible.    Suggested Mom ask for help with pumping, caring for pump parts, and latching to breast if baby is cueing.      Lactation Tools Discussed/Used Tools: Pump;Bottle;Flanges Flange Size: 24 Breast pump type: Double-Electric Breast Pump Pump Education: Setup, frequency, and cleaning Reason for Pumping: support milk supply/LPTI/PPH Pumping frequency: None today, 4-5 times yesterday  Interventions Interventions: Breast feeding basics reviewed;Skin to skin;Breast massage;Hand express;DEBP;Hand pump  Discharge Pump: Personal (Mom ordered a DEBP)  Consult Status Consult Status: Follow-up Date: 11/07/20 Follow-up type: In-patient    Broadus John 11/06/2020, 4:48 PM

## 2020-11-07 DIAGNOSIS — O34219 Maternal care for unspecified type scar from previous cesarean delivery: Secondary | ICD-10-CM | POA: Diagnosis not present

## 2020-11-07 DIAGNOSIS — Z3A00 Weeks of gestation of pregnancy not specified: Secondary | ICD-10-CM | POA: Diagnosis not present

## 2020-11-07 LAB — GLUCOSE, CAPILLARY: Glucose-Capillary: 84 mg/dL (ref 70–99)

## 2020-11-07 NOTE — Progress Notes (Signed)
Subjective: Postpartum Day 2: Cesarean Delivery 11/05/2020 at 1120 Patient reports incisional pain, tolerating PO, + flatus, and no problems voiding.    Objective: Vital signs in last 24 hours: Temp:  [98 F (36.7 C)-98.4 F (36.9 C)] 98.4 F (36.9 C) (07/18 0609) Pulse Rate:  [69-73] 69 (07/18 0609) Resp:  [17-18] 18 (07/18 0609) BP: (100-107)/(51-62) 105/62 (07/18 0609) SpO2:  [95 %-100 %] 100 % (07/18 9233)  Physical Exam:  General: alert, cooperative, appears stated age, and no distress Lochia: appropriate Uterine Fundus: firm Incision: Dressing intact DVT Evaluation: No evidence of DVT seen on physical exam.  Recent Labs    11/05/20 0946 11/06/20 0417  HGB 11.8* 9.0*  HCT 37.0 28.5*    Assessment/Plan: Status post Cesarean section. Doing well postoperatively.  Continue current care.  Darlina Rumpf, CNM 11/07/2020, 7:08 AM

## 2020-11-07 NOTE — Lactation Note (Addendum)
This note was copied from a baby's chart. Lactation Consultation Note  Patient Name: Kayla Walker Today's Date: 11/07/2020   Age:38 hours  LC checked in with RN, Patric Dykes to see if Mom is pumping or breast feeding. Currently mom offering formula for every feeding. RN to check in with Mom to see if she still wants to continue with breastfeeding and will let me know.   Maternal Data    Feeding    LATCH Score                    Lactation Tools Discussed/Used    Interventions    Discharge    Consult Status      [name]  [name] 11/07/2020, 7:59 PM

## 2020-11-08 LAB — SURGICAL PATHOLOGY

## 2020-11-08 MED ORDER — FERROUS SULFATE 325 (65 FE) MG PO TABS: 325.0000 mg | ORAL_TABLET | ORAL | 3 refills | Status: AC

## 2020-11-08 MED ORDER — IBUPROFEN 600 MG PO TABS: 600.0000 mg | ORAL_TABLET | Freq: Four times a day (QID) | ORAL | 0 refills | Status: DC

## 2020-11-08 MED ORDER — OXYCODONE HCL 5 MG PO TABS: 5.0000 mg | ORAL_TABLET | ORAL | 0 refills | Status: DC | PRN

## 2020-11-08 MED ORDER — ASCORBIC ACID 500 MG PO TABS
250.0000 mg | ORAL_TABLET | ORAL | Status: DC
  Administered 2020-11-08: 250 mg via ORAL
  Filled 2020-11-08: qty 1

## 2020-11-08 MED ORDER — FERROUS SULFATE 325 (65 FE) MG PO TABS
325.0000 mg | ORAL_TABLET | ORAL | Status: DC
  Administered 2020-11-08: 325 mg via ORAL
  Filled 2020-11-08: qty 1

## 2020-11-08 MED ORDER — ACETAMINOPHEN 325 MG PO TABS: 650.0000 mg | ORAL_TABLET | ORAL | 2 refills | Status: AC | PRN

## 2020-11-08 MED ORDER — COCONUT OIL OIL: 1.0000 "application " | TOPICAL_OIL | 0 refills | Status: AC | PRN

## 2020-11-08 NOTE — Social Work (Signed)
CSW verbally consulted by RN and informed MOB has concerns regarding formula.  CSW met with MOB to assess and offer support. MOB reported that FOB went to [facility] to obtain formula and they were asked for a prescription. MOB inquired on getting a prescription for infant's formula, as well as when [facility] would be able to provide the formula. MOB is already enrolled in [facility], however CSW provided MOB with the number to contact [facility] and inquire on the earliest that formula will be available. CSW also spoke with pediatrician regarding MOB's concerns. Pediatrician will follow-up with MOB to answer additional questions. MOB has two other children at home and inquired on additional resources. CSW informed MOB a referral will be made for [facility], as well as any additional agencies that can be of assistance. MOB was understanding and in agreement.   CSW identifies no further need for intervention and no barriers to discharge at this time.  Darra Lis, [place] [employer] [phone]

## 2020-11-08 NOTE — Lactation Note (Signed)
This note was copied from a baby's chart. Lactation Consultation Note  Patient Name: Kayla Walker Today's Date: 11/08/2020 Reason for consult: Follow-up assessment;Difficult latch;Late-preterm 34-36.6wks;Maternal endocrine disorder Age:38 hours  LC in to visit with P3 Mom of LPTI on day of probable discharge.  Baby at 6.9% weight loss.    Baby is being bottle fed 22 cal formula using extra slow flow nipples.  Baby is taking 15-38 ml at last feeding.  Mom has a DEBP set up at bedside, but hasn't been pumping.  Mom states she tries to latch baby occasionally, but baby sucks a couple times and then stops.    Concern about tongue restriction due to high arched palate, labial frenulum and short lingual frenulum noted.  Recommended OP lactation f/u and offered to message clinic, but Mom declined.  Offered to assist with positioning and latching.  Mom sitting in chair.  Pillow support added and unwrapped and undressed baby for STS. Mom has semi-flat nipples that are fairly large in diameter.  Reviewed breast massage and hand expression, drops of colostrum noted.  Baby trying to latch, but Mom pushing breast to baby.  Reviewed breast support and baby's head support.  Baby able to attain a deep latch and suck a couple times before slipping off to nipple.  Plan- 1- Keep baby STS as much as possible 2- Offer breast with cues, waking baby if its been 3 hrs since baby fed 3- Supplement per LPTI guidelines by slow flow nipple 4- Pump both breasts 15 mins, adding breast massage and hand expression 5-OP Lactation F/U recommended. 6- call for assistance prn.  LATCH Score Latch: Repeated attempts needed to sustain latch, nipple held in mouth throughout feeding, stimulation needed to elicit sucking reflex.  Audible Swallowing: None  Type of Nipple: Everted at rest and after stimulation (short nipple shafts)  Comfort (Breast/Nipple): Soft / non-tender  Hold (Positioning): Assistance needed to  correctly position infant at breast and maintain latch.  LATCH Score: 6   Lactation Tools Discussed/Used Tools: Pump;Flanges;Bottle Flange Size: 24 Breast pump type: Double-Electric Breast Pump Pump Education: Setup, frequency, and cleaning Pumping frequency: once yesterday  Interventions Interventions: Breast feeding basics reviewed;Assisted with latch;Skin to skin;Breast massage;Hand express;Adjust position;Support pillows;Position options;Expressed milk;Hand pump;DEBP;Education  Discharge Discharge Education: Engorgement and breast care;Outpatient recommendation Pump: Personal;Refer for rental (hand's free pump)  Consult Status Consult Status: Complete (mother declined follow up) (Mom states she will call for OP appt.  phone number provided) Date: 11/08/20 Follow-up type: Call as needed    Broadus John 11/08/2020, 10:31 AM

## 2020-11-08 NOTE — Progress Notes (Signed)
During am assessment Pressure dressing was still on POD #3. I took it off honeycomb dressing was saturated. Dr. Zigmund Daniel called and gave me verbal order to change it and to tell patient to take off in 5 to 7 days. New honeycomb applied per order. Patient had no complaints upon am assessment. Plan of care told. Plan to be discharged later.

## 2020-11-09 ENCOUNTER — Telehealth: Payer: Self-pay

## 2020-11-09 LAB — GLUCOSE, CAPILLARY: Glucose-Capillary: 98 mg/dL (ref 70–99)

## 2020-11-09 NOTE — Telephone Encounter (Signed)
Transition Care Management Follow-up Telephone Call Date of discharge and from where: 11/08/2020-[facility] How have you been since you were released from the hospital? Patient states she is doing ok.  Any questions or concerns? No  Items Reviewed: Did the pt receive and understand the discharge instructions provided? Yes  Medications obtained and verified? Yes  Other? No  Any new allergies since your discharge? No  Dietary orders reviewed? N/A Do you have support at home? Yes   Home Care and Equipment/Supplies: Were home health services ordered? not applicable If so, what is the name of the agency? N/A  Has the agency set up a time to come to the patient's home? not applicable Were any new equipment or medical supplies ordered?  No What is the name of the medical supply agency? N/A Were you able to get the supplies/equipment? not applicable Do you have any questions related to the use of the equipment or supplies? No  Functional Questionnaire: (I = Independent and D = Dependent) ADLs: I  Bathing/Dressing- I  Meal Prep- I  Eating- I  Maintaining continence- I  Transferring/Ambulation- I  Managing Meds- I  Follow up appointments reviewed:  PCP Hospital f/u appt confirmed? No   Specialist Hospital f/u appt confirmed? Yes  Scheduled to see [facility] on 11/14/2020 @ 8:30 AM. Are transportation arrangements needed? No  If their condition worsens, is the pt aware to call PCP or go to the Emergency Dept.? Yes Was the patient provided with contact information for the PCP's office or ED? Yes Was to pt encouraged to call back with questions or concerns? Yes

## 2020-11-14 ENCOUNTER — Other Ambulatory Visit: Payer: Self-pay

## 2020-11-14 ENCOUNTER — Encounter: Payer: Self-pay | Admitting: Advanced Practice Midwife

## 2020-11-14 ENCOUNTER — Encounter: Payer: Medicaid Other | Admitting: Advanced Practice Midwife

## 2020-11-14 ENCOUNTER — Ambulatory Visit (INDEPENDENT_AMBULATORY_CARE_PROVIDER_SITE_OTHER): Payer: Medicaid Other | Admitting: Advanced Practice Midwife

## 2020-11-14 VITALS — BP 127/81 | HR 64 | Wt 192.0 lb

## 2020-11-14 DIAGNOSIS — Z98891 History of uterine scar from previous surgery: Secondary | ICD-10-CM | POA: Diagnosis not present

## 2020-11-14 NOTE — Progress Notes (Signed)
   POSTOPERATIVE VISIT NOTE   Subjective:     Kayla Walker is a 40 y.o. [id number] who presents to the clinic weeks status post  c-section  on 11/05/2020. Eating a regular diet without difficulty. Bowel movements are normal. Pain is controlled with current analgesics. Medications being used: acetaminophen, ibuprofen (OTC), and narcotic analgesics including oxycodone/acetaminophen (Percocet, Tylox).  The following portions of the patient's history were reviewed and updated as appropriate: allergies, current medications, past family history, past medical history, past social history, past surgical history, and problem list.  Review of Systems Pertinent items are noted in HPI.    Objective:    BP 127/81 (BP Location: Left Arm, Patient Position: Sitting, Cuff Size: Normal)   Pulse 64   Wt 192 lb (87.1 kg)   Breastfeeding Yes   BMI 37.50 kg/m  General:  alert, cooperative, and no distress  Abdomen: soft, bowel sounds active, non-tender  Incision:   healing well, no drainage, no erythema, no hernia, no seroma, no swelling, no dehiscence, incision well approximated    Pathology Results: FINAL MICROSCOPIC DIAGNOSIS:   A. PLACENTA, SINGLETON, DELIVERY:  -  Mature placenta (446 g) with pigmented histiocytes  -  Three vessel umbilical cord  -  No evidence of acute chorioamnionitis or funisitis    Assessment:   Doing well postoperatively. Operative findings again reviewed. Pathology report discussed.    Plan:   1. Continue any current medications. 2. FU for PP visit with GTT in 4 weeks  3. Activity restrictions:  normal PP restrictions still in place    [place], CNM  11/14/20  9:26 AM

## 2020-11-17 ENCOUNTER — Telehealth (HOSPITAL_COMMUNITY): Payer: Self-pay | Admitting: *Deleted

## 2020-11-17 NOTE — Telephone Encounter (Signed)
Unable to contact mom for hospital discharge follow-up call.  Odis Hollingshead, RN 11/17/2020 at 11:11am

## 2020-12-09 ENCOUNTER — Ambulatory Visit: Payer: Medicaid Other | Admitting: Obstetrics & Gynecology

## 2020-12-12 ENCOUNTER — Other Ambulatory Visit: Payer: Medicaid Other

## 2020-12-12 ENCOUNTER — Encounter: Payer: Self-pay | Admitting: Women's Health

## 2020-12-12 ENCOUNTER — Other Ambulatory Visit: Payer: Self-pay

## 2020-12-12 ENCOUNTER — Other Ambulatory Visit (HOSPITAL_COMMUNITY)
Admission: RE | Admit: 2020-12-12 | Discharge: 2020-12-12 | Disposition: A | Discharge status: Home / Self Care | Payer: Medicaid Other | Source: Ambulatory Visit | Attending: Obstetrics & Gynecology | Admitting: Obstetrics & Gynecology

## 2020-12-12 ENCOUNTER — Ambulatory Visit (INDEPENDENT_AMBULATORY_CARE_PROVIDER_SITE_OTHER): Payer: Medicaid Other | Admitting: Women's Health

## 2020-12-12 DIAGNOSIS — O24410 Gestational diabetes mellitus in pregnancy, diet controlled: Secondary | ICD-10-CM

## 2020-12-12 DIAGNOSIS — Z98891 History of uterine scar from previous surgery: Secondary | ICD-10-CM

## 2020-12-12 DIAGNOSIS — Z124 Encounter for screening for malignant neoplasm of cervix: Secondary | ICD-10-CM | POA: Insufficient documentation

## 2020-12-12 DIAGNOSIS — Z113 Encounter for screening for infections with a predominantly sexual mode of transmission: Secondary | ICD-10-CM | POA: Insufficient documentation

## 2020-12-12 DIAGNOSIS — Z8632 Personal history of gestational diabetes: Secondary | ICD-10-CM

## 2020-12-12 NOTE — Patient Instructions (Signed)
Tips To Increase Milk Supply Lots of water! Enough so that your urine is clear Plenty of calories, if you're not getting enough calories, your milk supply can decrease Breastfeed/pump often, every 2-3 hours x 20-31mns Fenugreek 3 pills 3 times a day, this may make your urine smell like maple syrup Mother's Milk Tea Lactation cookies, google for the recipe Real oatmeal Body Armor sports drinks Greater Than hydration drink

## 2020-12-12 NOTE — Progress Notes (Signed)
POSTPARTUM VISIT Patient name: Kayla Walker MRN [id number]  Date of birth: 1981/01/19 Chief Complaint:   Postpartum Care  History of Present Illness:   Kayla Walker is a 40 y.o. G59P2103 African American female being seen today for a postpartum visit. She is 5 weeks postpartum following a repeat cesarean section, low transverse incision at 35.0 gestational weeks d/t placental abruption. IOL: no, for n/a. Anesthesia: spinal.  Laceration: n/a.  Complications: none. Inpatient contraception: no.   Pregnancy complicated by N9GX . Tobacco use: no. Substance use disorder: no. Last pap smear: 2017 and results were  normal per pt . Next pap smear due: now No LMP recorded.  Postpartum course has been uncomplicated. Bleeding scant staining. Bowel function is normal. Bladder function is normal. Urinary incontinence? no, fecal incontinence? no Patient is not sexually active. Last sexual activity: prior to birth of baby. Desired contraception: Condoms. Patient does not want a pregnancy in the future.  Desired family size is 3 children.   Upstream - 12/12/20 0853       Pregnancy Intention Screening   Does the patient want to become pregnant in the next year? No    Does the patient's partner want to become pregnant in the next year? No    Would the patient like to discuss contraceptive options today? No      Contraception Wrap Up   Current Method Female Condom    End Method Female Condom    Contraception Counseling Provided Yes            The pregnancy intention screening data noted above was reviewed. Potential methods of contraception were discussed. The patient elected to proceed with Female Condom.  Edinburgh Postpartum Depression Screening: negative  Edinburgh Postnatal Depression Scale - 12/12/20 0859       Edinburgh Postnatal Depression Scale:  In the Past 7 Days   I have been able to laugh and see the funny side of things. 0    I have looked forward with enjoyment to things. 0    I have  blamed myself unnecessarily when things went wrong. 1    I have been anxious or worried for no good reason. 0    I have felt scared or panicky for no good reason. 0    Things have been getting on top of me. 2    I have been so unhappy that I have had difficulty sleeping. 0    I have felt sad or miserable. 1    I have been so unhappy that I have been crying. 1    The thought of harming myself has occurred to me. 0    Edinburgh Postnatal Depression Scale Total 5             GAD 7 : Generalized Anxiety Score 07/21/2020 06/09/2020  Nervous, Anxious, on Edge 0 0  Control/stop worrying 0 0  Worry too much - different things 0 0  Trouble relaxing 0 0  Restless 0 0  Easily annoyed or irritable 1 1  Afraid - awful might happen 0 0  Total GAD 7 Score 1 1     Baby's course has been uncomplicated. Baby is feeding by breast: milk supply inadequate . Infant has a pediatrician/family doctor? Yes.  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  Pt has material needs met for her and baby: Yes.   Review of Systems:   Pertinent items are noted in HPI Denies Abnormal vaginal discharge w/ itching/odor/irritation, headaches, visual  changes, shortness of breath, chest pain, abdominal pain, severe nausea/vomiting, or problems with urination or bowel movements. Pertinent History Reviewed:  Reviewed past medical,surgical, obstetrical and family history.  Reviewed problem list, medications and allergies. OB History  Gravida Para Term Preterm AB Living  '3 3 2 1   3  ' SAB IAB Ectopic Multiple Live Births        0 3    # Outcome Date GA Lbr Len/2nd Weight Sex Delivery Anes PTL Lv  3 Preterm 11/05/20 [redacted]w[redacted]d 6 lb 10.7 oz (3.025 kg) M CS-LTranv Spinal  LIV  2 Term 10/26/16 [id number] lb 12.9 oz (3.54 kg) F CS-LTranv Spinal N LIV     Complications: Gestational diabetes  1 Term 08/24/15 4181w0d lb 8 oz (3.856 kg) F CS-LTranv EPI N LIV     Complications: Failure to Progress in Second  Stage   Physical Assessment:   Vitals:   12/12/20 0851  BP: 130/82  Pulse: 78  Weight: 185 lb (83.9 kg)  Body mass index is 36.13 kg/m.       Physical Examination:   General appearance: alert, well appearing, and in no distress  Mental status: alert, oriented to person, place, and time  Skin: warm & dry   Cardiovascular: normal heart rate noted   Respiratory: normal respiratory effort, no distress   Breasts: deferred, no complaints   Abdomen: soft, non-tender, c/s incision well healed  Pelvic: VULVA: normal appearing vulva with no masses, tenderness or lesions, VAGINA: normal appearing vagina with normal color and discharge, no lesions, CERVIX: normal appearing cervix without discharge or lesions. Thin prep pap obtained: Yes  Rectal: not examined  Extremities: Edema: none   Chaperone: Latisha Cresenzo         No results found for this or any previous visit (from the past 24 hour(s)).  Assessment & Plan:  1) Postpartum exam 2) 5 wks s/p repeat cesarean section, low transverse incision @ 35.0wks d/t abruption 3) breast & bottle feeding 4) Depression screening 5) Contraception counseling>plans condoms  Essential components of care per ACOG recommendations:  1.  Mood and well being:  If positive depression screen, discussed and plan developed.  If using tobacco we discussed reduction/cessation and risk of relapse If current substance abuse, we discussed and referral to local resources was offered.   2. Infant care and feeding:  If breastfeeding, discussed returning to work, pumping, breastfeeding-associated pain, guidance regarding return to fertility while lactating if not using another method. If needed, patient was provided with a letter to be allowed to pump q 2-3hrs to support lactation in a private location with access to a refrigerator to store breastmilk.   Recommended that all caregivers be immunized for flu, pertussis and other preventable communicable diseases If pt  does not have material needs met for her/baby, referred to local resources for help obtaining these.  3. Sexuality, contraception and birth spacing Provided guidance regarding sexuality, management of dyspareunia, and resumption of intercourse Discussed avoiding interpregnancy interval <[id number] recommended birth spacing of 18 months  4. Sleep and fatigue Discussed coping options for fatigue and sleep disruption Encouraged family/partner/community support of 4 hrs of uninterrupted sleep to help with mood and fatigue  5. Physical recovery  If pt had a C/S, assessed incisional pain and providing guidance on normal vs prolonged recovery If pt had a laceration, perineal healing and pain reviewed.  If urinary or fecal incontinence, discussed management and referred to PT or uro/gyn if indicated  Patient is safe to resume physical activity. Discussed attainment of healthy weight.  6.  Chronic disease management Discussed pregnancy complications if any, and their implications for future childbearing and long-term maternal health. Review recommendations for prevention of recurrent pregnancy complications, such as 17 hydroxyprogesterone caproate to reduce risk for recurrent PTB not applicable, or aspirin to reduce risk of preeclampsia not applicable. Pt had GDM: yes. If yes, 2hr GTT scheduled: doing today. Reviewed medications and non-pregnant dosing including consideration of whether pt is breastfeeding using a reliable resource such as LactMed: not applicable Referred for f/u w/ PCP or subspecialist providers as indicated: not applicable  7. Health maintenance Mammogram at 38yo or earlier if indicated Pap smears as indicated  Meds: No orders of the defined types were placed in this encounter.   Follow-up: Return in about 1 year (around 12/12/2021) for Physical.   No orders of the defined types were placed in this encounter.   Center, [facility] 12/12/2020 9:26 AM

## 2020-12-12 NOTE — Addendum Note (Signed)
Addended by: Octaviano Glow on: 12/12/2020 11:27 AM   Modules accepted: Orders

## 2020-12-13 LAB — GLUCOSE TOLERANCE, 2 HOURS W/ 1HR
Glucose, 1 hour: 154 mg/dL (ref 65–179)
Glucose, 2 hour: 136 mg/dL (ref 65–152)
Glucose, Fasting: 87 mg/dL (ref 65–91)

## 2020-12-14 LAB — CYTOLOGY - PAP
Chlamydia: NEGATIVE
Comment: NEGATIVE
Comment: NEGATIVE
Comment: NORMAL
Diagnosis: NEGATIVE
Diagnosis: REACTIVE
High risk HPV: NEGATIVE
Neisseria Gonorrhea: NEGATIVE

## 2021-05-11 ENCOUNTER — Encounter (HOSPITAL_COMMUNITY): Payer: Self-pay

## 2021-05-11 ENCOUNTER — Other Ambulatory Visit: Payer: Self-pay

## 2021-05-11 ENCOUNTER — Emergency Department (HOSPITAL_COMMUNITY)
Admission: EM | Admit: 2021-05-11 | Discharge: 2021-05-11 | Disposition: A | Discharge status: Home / Self Care | Payer: Medicaid Other | Attending: Emergency Medicine | Admitting: Emergency Medicine

## 2021-05-11 DIAGNOSIS — J020 Streptococcal pharyngitis: Secondary | ICD-10-CM | POA: Insufficient documentation

## 2021-05-11 DIAGNOSIS — H9203 Otalgia, bilateral: Secondary | ICD-10-CM | POA: Insufficient documentation

## 2021-05-11 DIAGNOSIS — R000 Tachycardia, unspecified: Secondary | ICD-10-CM | POA: Insufficient documentation

## 2021-05-11 DIAGNOSIS — Z20822 Contact with and (suspected) exposure to covid-19: Secondary | ICD-10-CM | POA: Diagnosis not present

## 2021-05-11 DIAGNOSIS — J029 Acute pharyngitis, unspecified: Secondary | ICD-10-CM | POA: Diagnosis present

## 2021-05-11 LAB — RESP PANEL BY RT-PCR (FLU A&B, COVID) ARPGX2
Influenza A by PCR: NEGATIVE
Influenza B by PCR: NEGATIVE
SARS Coronavirus 2 by RT PCR: NEGATIVE

## 2021-05-11 LAB — GROUP A STREP BY PCR: Group A Strep by PCR: DETECTED — AB

## 2021-05-11 MED ORDER — IBUPROFEN 400 MG PO TABS: 400.0000 mg | ORAL_TABLET | Freq: Four times a day (QID) | ORAL | 0 refills | Status: AC | PRN

## 2021-05-11 MED ORDER — ACETAMINOPHEN 325 MG PO TABS
650.0000 mg | ORAL_TABLET | Freq: Once | ORAL | Status: AC
  Administered 2021-05-11: 650 mg via ORAL
  Filled 2021-05-11: qty 2

## 2021-05-11 MED ORDER — IBUPROFEN 800 MG PO TABS
800.0000 mg | ORAL_TABLET | Freq: Once | ORAL | Status: AC
  Administered 2021-05-11: 800 mg via ORAL
  Filled 2021-05-11: qty 1

## 2021-05-11 MED ORDER — AMOXICILLIN 500 MG PO CAPS: 500.0000 mg | ORAL_CAPSULE | Freq: Two times a day (BID) | ORAL | 0 refills | Status: AC

## 2021-05-11 NOTE — ED Provider Notes (Signed)
[facility] Provider Note   CSN: [id number] Arrival date & time: 05/11/21  2111     History  Chief Complaint  Patient presents with   Sore Throat    Kayla Walker is a 41 y.o. female.   Sore Throat   This patient is a 41 year old female, she denies chronic medical conditions, reports that she has had a sore throat that started yesterday with an associated fever and a headache, she has bilateral ear pain but has not been coughing.  She has multiple kids with similar symptoms at home, nobody is seen the doctor or been diagnosed with any specific illnesses.  She took Tylenol at 2:00 PM  Home Medications Prior to Admission medications   Medication Sig Start Date End Date Taking? Authorizing Provider  amoxicillin (AMOXIL) 500 MG capsule Take 1 capsule (500 mg total) by mouth 2 (two) times daily for 10 days. 05/11/21 05/21/21 Yes Noemi Chapel, MD  ibuprofen (ADVIL) 400 MG tablet Take 1 tablet (400 mg total) by mouth every 6 (six) hours as needed. 05/11/21  Yes Noemi Chapel, MD  acetaminophen (TYLENOL) 325 MG tablet Take 2 tablets (650 mg total) by mouth every 4 (four) hours as needed. Patient not taking: No sig reported 11/08/20 11/08/21  Erskine Emery, MD  coconut oil OIL Apply 1 application topically as needed. Patient not taking: No sig reported 11/08/20   Erskine Emery, MD  ferrous sulfate 325 (65 FE) MG tablet Take 1 tablet (325 mg total) by mouth every other day. Patient not taking: Reported on 12/12/2020 11/08/20   Erskine Emery, MD  Prenatal Vit-Fe Fumarate-FA (MULTIVITAMIN-PRENATAL) 27-0.8 MG TABS tablet Take 1 tablet by mouth daily.     [provider]      Allergies    Patient has no known allergies.    Review of Systems   Review of Systems  Constitutional:  Positive for fever.  HENT:  Positive for sore throat.    Physical Exam Updated Vital Signs BP (!) 143/78    Pulse (!) 104    Temp (!) 102 F (38.9 C) (Oral)    Resp (!) 24    Ht  1.651 m (5\' 5" )    Wt 83.9 kg    SpO2 92%    BMI 30.79 kg/m  Physical Exam Vitals and nursing note reviewed.  Constitutional:      Appearance: She is well-developed. She is not diaphoretic.  HENT:     Head: Normocephalic and atraumatic.     Nose: No congestion or rhinorrhea.     Mouth/Throat:     Mouth: Mucous membranes are moist.     Pharynx: Uvula midline. Oropharyngeal exudate and posterior oropharyngeal erythema present. No pharyngeal swelling.     Tonsils: Tonsillar exudate present. No tonsillar abscesses.     Comments: Uvula and bilateral tonsillar pillars appear normal except for some exudative spots bilaterally.  There is no signs of peritonsillar abscess, uvula is midline, phonation is normal, she is able to fully rotate flex and extend her neck without difficulty. Eyes:     General:        Right eye: No discharge.        Left eye: No discharge.     Conjunctiva/sclera: Conjunctivae normal.  Neck:     Comments: Supple neck with no lymphadenopathy Cardiovascular:     Rate and Rhythm: Tachycardia present.     Comments: Tachycardic to 105 bpm Pulmonary:     Effort: Pulmonary effort is normal. No respiratory distress.  Abdominal:     Comments: No hepatosplenomegaly or abdominal tenderness  Skin:    General: Skin is warm and dry.     Findings: No erythema or rash.  Neurological:     Mental Status: She is alert.     Coordination: Coordination normal.    ED Results / Procedures / Treatments   Labs (all labs ordered are listed, but only abnormal results are displayed) Labs Reviewed  GROUP A STREP BY PCR - Abnormal; Notable for the following components:      Result Value   Group A Strep by PCR DETECTED (*)    All other components within normal limits  RESP PANEL BY RT-PCR (FLU A&B, COVID) [id number]    EKG None  Radiology No results found.  Procedures Procedures    Medications Ordered in ED Medications  acetaminophen (TYLENOL) tablet 650 mg (650 mg Oral Given  05/11/21 2205)  ibuprofen (ADVIL) tablet 800 mg (800 mg Oral Given 05/11/21 2205)    ED Course/ Medical Decision Making/ A&P                           Medical Decision Making Patient with presentation consistent with having possible strep throat or other purulent exudative pharyngitis.  Testing  Amount and/or Complexity of Data Reviewed Labs: ordered.    Details: I personally viewed and interpreted the results, the positive strep test Discussion of management or test interpretation with external provider(s): Positive strep consistent with patient's exam, diagnosis of strep throat given, ibuprofen and amoxicillin for home  Risk OTC drugs. Prescription drug management. Risk Details: Consider doing CT scan of the neck however there is no signs of peritonsillar abscess or retropharyngeal abscess, the patient is agreeable to discharge           Final Clinical Impression(s) / ED Diagnoses Final diagnoses:  Strep throat    Rx / DC Orders ED Discharge Orders          Ordered    amoxicillin (AMOXIL) 500 MG capsule  2 times daily        05/11/21 2251    ibuprofen (ADVIL) 400 MG tablet  Every 6 hours PRN        05/11/21 2251              Noemi Chapel, MD 05/11/21 2252

## 2021-05-11 NOTE — Discharge Instructions (Signed)
If you do not have a family doctor see the list of local family doctors below.  You do have strep throat, please see the attached instructions regarding your illness.  This should get better over the next couple of days but you will be contagious for up to 48 hours after starting antibiotics.  Tylenol and ibuprofen - alternate every 4 hours.  Please take Amoxicillin 500mg  by mouth 2 times daily until the medicine is completed.  This will treat certain infections.

## 2021-05-11 NOTE — ED Triage Notes (Signed)
Pt presents to ED from home with c/o sore throat that started yesterday. Also reports right knee pain.

## 2021-05-12 ENCOUNTER — Telehealth: Payer: Self-pay

## 2021-05-12 NOTE — Telephone Encounter (Signed)
Transition Care Management Unsuccessful Follow-up Telephone Call  Date of discharge and from where:  05/11/2021 from [facility]  Attempts:  1st Attempt  Reason for unsuccessful TCM follow-up call:  Left voice message

## 2021-05-15 NOTE — Telephone Encounter (Signed)
Transition Care Management Unsuccessful Follow-up Telephone Call  Date of discharge and from where:  05/11/2021-[facility]   Attempts:  2nd Attempt  Reason for unsuccessful TCM follow-up call:  Left voice message

## 2021-05-16 NOTE — Telephone Encounter (Signed)
Transition Care Management Unsuccessful Follow-up Telephone Call  Date of discharge and from where:  05/11/2021 from [facility]  Attempts:  3rd Attempt  Reason for unsuccessful TCM follow-up call:  Unable to reach patient

## 2021-06-01 ENCOUNTER — Encounter: Payer: Self-pay | Admitting: Emergency Medicine

## 2021-06-01 ENCOUNTER — Other Ambulatory Visit: Payer: Self-pay

## 2021-06-01 ENCOUNTER — Ambulatory Visit
Admission: EM | Admit: 2021-06-01 | Discharge: 2021-06-01 | Disposition: A | Discharge status: Home / Self Care | Payer: Medicaid Other | Attending: Urgent Care | Admitting: Urgent Care

## 2021-06-01 DIAGNOSIS — R300 Dysuria: Secondary | ICD-10-CM | POA: Diagnosis not present

## 2021-06-01 DIAGNOSIS — R350 Frequency of micturition: Secondary | ICD-10-CM | POA: Diagnosis not present

## 2021-06-01 DIAGNOSIS — N3001 Acute cystitis with hematuria: Secondary | ICD-10-CM | POA: Insufficient documentation

## 2021-06-01 LAB — POCT URINALYSIS DIP (MANUAL ENTRY)
Bilirubin, UA: NEGATIVE
Glucose, UA: NEGATIVE mg/dL
Ketones, POC UA: NEGATIVE mg/dL
Nitrite, UA: POSITIVE — AB
Protein Ur, POC: >=300 mg/dL — AB
Spec Grav, UA: 1.025 (ref 1.010–1.025)
Urobilinogen, UA: 0.2 E.U./dL
pH, UA: 6 (ref 5.0–8.0)

## 2021-06-01 MED ORDER — CEPHALEXIN 500 MG PO CAPS: 500.0000 mg | ORAL_CAPSULE | Freq: Two times a day (BID) | ORAL | 0 refills | Status: DC

## 2021-06-01 NOTE — ED Provider Notes (Signed)
[facility]   MRN: [id number] DOB: [birth year]  Subjective:   Kayla Walker is a 41 y.o. female presenting for 3-day history of acute onset dysuria, urinary frequency, low back pain.  Patient does not hydrate with much water.  States that she drinks coffee and juice.  For this particular episode of her symptoms she has been doing a lot of cranberry juice.  Denies fever, nausea, vomiting, flank pain, hematuria.  No concern for STI.  Patient is no longer breast-feeding.  No current facility-administered medications for this encounter.  Current Outpatient Medications:    acetaminophen (TYLENOL) 325 MG tablet, Take 2 tablets (650 mg total) by mouth every 4 (four) hours as needed. (Patient not taking: No sig reported), Disp: 100 tablet, Rfl: 2   coconut oil OIL, Apply 1 application topically as needed. (Patient not taking: No sig reported), Disp: , Rfl: 0   ferrous sulfate 325 (65 FE) MG tablet, Take 1 tablet (325 mg total) by mouth every other day. (Patient not taking: Reported on 12/12/2020), Disp: 30 tablet, Rfl: 3   ibuprofen (ADVIL) 400 MG tablet, Take 1 tablet (400 mg total) by mouth every 6 (six) hours as needed., Disp: 30 tablet, Rfl: 0   Prenatal Vit-Fe Fumarate-FA (MULTIVITAMIN-PRENATAL) 27-0.8 MG TABS tablet, Take 1 tablet by mouth daily. , Disp: , Rfl:    No Known Allergies  Past Medical History:  Diagnosis Date   Gestational diabetes    diet controlled   Medical history non-contributory      Past Surgical History:  Procedure Laterality Date   CESAREAN SECTION     CESAREAN SECTION N/A 10/26/2016   Procedure: CESAREAN SECTION;  Surgeon: Chancy Milroy, MD;  Location: [facility];  Service: Obstetrics;  Laterality: N/A;   CESAREAN SECTION N/A 11/05/2020   Procedure: CESAREAN SECTION;  Surgeon: Janyth Pupa, DO;  Location: [facility];  Service: Obstetrics;  Laterality: N/A;    Family History  Problem Relation Age of Onset   Autism Daughter    Alcohol  abuse Neg Hx    Arthritis Neg Hx    Asthma Neg Hx    Birth defects Neg Hx    Cancer Neg Hx    COPD Neg Hx    Depression Neg Hx    Diabetes Neg Hx    Drug abuse Neg Hx    Early death Neg Hx    Hearing loss Neg Hx    Hyperlipidemia Neg Hx    Heart disease Neg Hx    Hypertension Neg Hx    Kidney disease Neg Hx    Learning disabilities Neg Hx    Mental illness Neg Hx    Mental retardation Neg Hx    Miscarriages / Stillbirths Neg Hx    Stroke Neg Hx    Vision loss Neg Hx    Varicose Veins Neg Hx     Social History   Tobacco Use   Smoking status: Never   Smokeless tobacco: Never  Vaping Use   Vaping Use: Never used  Substance Use Topics   Alcohol use: No   Drug use: No    ROS   Objective:   Vitals: BP (!) 154/91 (BP Location: Right Arm)    Pulse 100    Temp 99.4 F (37.4 C) (Oral)    Resp 18    Ht 5\' 5"  (1.651 m)    Wt 185 lb (83.9 kg)    LMP 05/21/2021 (Approximate)    SpO2 93%    Breastfeeding  No    BMI 30.79 kg/m   Physical Exam Constitutional:      General: She is not in acute distress.    Appearance: Normal appearance. She is well-developed. She is not ill-appearing, toxic-appearing or diaphoretic.  HENT:     Head: Normocephalic and atraumatic.     Nose: Nose normal.     Mouth/Throat:     Mouth: Mucous membranes are moist.     Pharynx: Oropharynx is clear.  Eyes:     General: No scleral icterus.       Right eye: No discharge.        Left eye: No discharge.     Extraocular Movements: Extraocular movements intact.     Conjunctiva/sclera: Conjunctivae normal.  Cardiovascular:     Rate and Rhythm: Normal rate.  Pulmonary:     Effort: Pulmonary effort is normal.  Abdominal:     General: Bowel sounds are normal. There is no distension.     Palpations: Abdomen is soft. There is no mass.     Tenderness: There is no abdominal tenderness. There is no right CVA tenderness, left CVA tenderness, guarding or rebound.  Skin:    General: Skin is warm and dry.   Neurological:     General: No focal deficit present.     Mental Status: She is alert and oriented to person, place, and time.  Psychiatric:        Mood and Affect: Mood normal.        Behavior: Behavior normal.        Thought Content: Thought content normal.        Judgment: Judgment normal.    Results for orders placed or performed during the hospital encounter of 06/01/21 (from the past 24 hour(s))  POCT urinalysis dipstick     Status: Abnormal   Collection Time: 06/01/21 11:01 AM  Result Value Ref Range   Color, UA straw (A) yellow   Clarity, UA cloudy (A) clear   Glucose, UA negative negative mg/dL   Bilirubin, UA negative negative   Ketones, POC UA negative negative mg/dL   Spec Grav, UA 1.025 1.010 - 1.025   Blood, UA large (A) negative   pH, UA 6.0 5.0 - 8.0   Protein Ur, POC >=300 (A) negative mg/dL   Urobilinogen, UA 0.2 0.2 or 1.0 E.U./dL   Nitrite, UA Positive (A) Negative   Leukocytes, UA Large (3+) (A) Negative    Assessment and Plan :   PDMP not reviewed this encounter.  1. Acute cystitis with hematuria   2. Urinary frequency   3. Dysuria     Start Keflex to cover for acute cystitis, urine culture pending.  Recommended aggressive hydration, limiting urinary irritants. Counseled patient on potential for adverse effects with medications prescribed/recommended today, ER and return-to-clinic precautions discussed, patient verbalized understanding.    Jaynee Eagles, [place] 06/01/21 1115

## 2021-06-01 NOTE — ED Triage Notes (Signed)
Pt reports urinary frequency, dysuria, lower back pain x3 days. Pt reports has tried cranberry juice and advil with no relief of symptoms. Pt denies any known fever, n/v.

## 2021-06-01 NOTE — Discharge Instructions (Signed)

## 2021-06-04 LAB — URINE CULTURE: Culture: >=100000 — AB

## 2021-08-16 ENCOUNTER — Encounter: Payer: Self-pay | Admitting: Emergency Medicine

## 2021-08-16 ENCOUNTER — Ambulatory Visit
Admission: EM | Admit: 2021-08-16 | Discharge: 2021-08-16 | Disposition: A | Discharge status: Home / Self Care | Payer: Medicaid Other | Attending: Family Medicine | Admitting: Family Medicine

## 2021-08-16 DIAGNOSIS — J0300 Acute streptococcal tonsillitis, unspecified: Secondary | ICD-10-CM | POA: Diagnosis not present

## 2021-08-16 LAB — POCT RAPID STREP A (OFFICE): Rapid Strep A Screen: POSITIVE — AB

## 2021-08-16 MED ORDER — LIDOCAINE VISCOUS HCL 2 % MT SOLN: 10.0000 mL | OROMUCOSAL | 0 refills | Status: DC | PRN

## 2021-08-16 MED ORDER — AMOXICILLIN 875 MG PO TABS: 875.0000 mg | ORAL_TABLET | Freq: Two times a day (BID) | ORAL | 0 refills | Status: DC

## 2021-08-16 NOTE — ED Provider Notes (Signed)
?[facility] ? ? ? ?CSN: [id number] ?Arrival date & time: 08/16/21  0940 ? ? ?  ? ?History   ?Chief Complaint ?No chief complaint on file. ? ? ?HPI ?Kayla Walker is a 40 y.o. female.  ? ?Presenting today with 3-day history of progressively worsening sore, swollen throat, headache, fatigue.  Denies fever, chills, cough, congestion, chest pain, shortness of breath, abdominal pain, nausea vomiting or diarrhea.  So far trying Motrin with mild temporary relief of symptoms.  No known sick contacts recently. ? ? ?Past Medical History:  ?Diagnosis Date  ? Gestational diabetes   ? diet controlled  ? Medical history non-contributory   ? ? ?Patient Active Problem List  ? Diagnosis Date Noted  ? History of gestational diabetes 08/16/2016  ? Uterine fibroids 07/30/2016  ? History of cesarean delivery 04/24/2016  ? ? ?Past Surgical History:  ?Procedure Laterality Date  ? CESAREAN SECTION    ? CESAREAN SECTION N/A 10/26/2016  ? Procedure: CESAREAN SECTION;  Surgeon: Chancy Milroy, MD;  Location: [facility];  Service: Obstetrics;  Laterality: N/A;  ? CESAREAN SECTION N/A 11/05/2020  ? Procedure: CESAREAN SECTION;  Surgeon: Janyth Pupa, DO;  Location: [facility];  Service: Obstetrics;  Laterality: N/A;  ? ? ?OB History   ? ? Gravida  ?3  ? Para  ?3  ? Term  ?2  ? Preterm  ?1  ? AB  ?   ? Living  ?3  ?  ? ? SAB  ?   ? IAB  ?   ? Ectopic  ?   ? Multiple  ?0  ? Live Births  ?3  ?   ?  ?  ? ? ? ?Home Medications   ? ?Prior to Admission medications   ?Medication Sig Start Date End Date Taking? Authorizing Provider  ?amoxicillin (AMOXIL) 875 MG tablet Take 1 tablet (875 mg total) by mouth 2 (two) times daily. 08/16/21  Yes Volney American, PA-C  ?lidocaine (XYLOCAINE) 2 % solution Use as directed 10 mLs in the mouth or throat every 3 (three) hours as needed for mouth pain. 08/16/21  Yes Volney American, PA-C  ?acetaminophen (TYLENOL) 325 MG tablet Take 2 tablets (650 mg total) by mouth every 4 (four) hours  as needed. ?Patient not taking: No sig reported 11/08/20 11/08/21  Erskine Emery, MD  ?cephALEXin (KEFLEX) 500 MG capsule Take 1 capsule (500 mg total) by mouth 2 (two) times daily. 06/01/21   Jaynee Eagles, PA-C  ?coconut oil OIL Apply 1 application topically as needed. ?Patient not taking: No sig reported 11/08/20   Erskine Emery, MD  ?ferrous sulfate 325 (65 FE) MG tablet Take 1 tablet (325 mg total) by mouth every other day. ?Patient not taking: Reported on 12/12/2020 11/08/20   Erskine Emery, MD  ?ibuprofen (ADVIL) 400 MG tablet Take 1 tablet (400 mg total) by mouth every 6 (six) hours as needed. 05/11/21   Noemi Chapel, MD  ?Prenatal Vit-Fe Fumarate-FA (MULTIVITAMIN-PRENATAL) 27-0.8 MG TABS tablet Take 1 tablet by mouth daily.     [provider]  ? ? ?Family History ?Family History  ?Problem Relation Age of Onset  ? Autism Daughter   ? Alcohol abuse Neg Hx   ? Arthritis Neg Hx   ? Asthma Neg Hx   ? Birth defects Neg Hx   ? Cancer Neg Hx   ? COPD Neg Hx   ? Depression Neg Hx   ? Diabetes Neg Hx   ?  Drug abuse Neg Hx   ? Early death Neg Hx   ? Hearing loss Neg Hx   ? Hyperlipidemia Neg Hx   ? Heart disease Neg Hx   ? Hypertension Neg Hx   ? Kidney disease Neg Hx   ? Learning disabilities Neg Hx   ? Mental illness Neg Hx   ? Mental retardation Neg Hx   ? Miscarriages / Stillbirths Neg Hx   ? Stroke Neg Hx   ? Vision loss Neg Hx   ? Varicose Veins Neg Hx   ? ? ?Social History ?Social History  ? ?Tobacco Use  ? Smoking status: Never  ? Smokeless tobacco: Never  ?Vaping Use  ? Vaping Use: Never used  ?Substance Use Topics  ? Alcohol use: No  ? Drug use: No  ? ? ? ?Allergies   ?Patient has no known allergies. ? ? ?Review of Systems ?Review of Systems ?Per HPI ? ?Physical Exam ?Triage Vital Signs ?ED Triage Vitals  ?Enc Vitals Group  ?   BP 08/16/21 1011 134/85  ?   Pulse Rate 08/16/21 1011 83  ?   Resp 08/16/21 1011 18  ?   Temp 08/16/21 1011 99 ?F (37.2 ?C)  ?   Temp Source 08/16/21 1011 Oral  ?   SpO2 08/16/21  1011 94 %  ?   Weight --   ?   Height --   ?   Head Circumference --   ?   Peak Flow --   ?   Pain Score 08/16/21 1012 7  ?   Pain Loc --   ?   Pain Edu? --   ?   Excl. in [place]? --   ? ?No data found. ? ?Updated Vital Signs ?BP 134/85 (BP Location: Right Arm)   Pulse 83   Temp 99 ?F (37.2 ?C) (Oral)   Resp 18   LMP 07/31/2021 (Exact Date)   SpO2 94%  ? ?Visual Acuity ?Right Eye Distance:   ?Left Eye Distance:   ?Bilateral Distance:   ? ?Right Eye Near:   ?Left Eye Near:    ?Bilateral Near:    ? ?Physical Exam ?Vitals and nursing note reviewed.  ?Constitutional:   ?   Appearance: Normal appearance. She is not ill-appearing.  ?HENT:  ?   Head: Atraumatic.  ?   Nose: Nose normal.  ?   Mouth/Throat:  ?   Pharynx: Oropharyngeal exudate and posterior oropharyngeal erythema present.  ?   Comments: Significant tonsillar erythema, edema bilaterally, uvula midline, oral airway patent ?Eyes:  ?   Extraocular Movements: Extraocular movements intact.  ?   Conjunctiva/sclera: Conjunctivae normal.  ?Cardiovascular:  ?   Rate and Rhythm: Normal rate and regular rhythm.  ?   Heart sounds: Normal heart sounds.  ?Pulmonary:  ?   Effort: Pulmonary effort is normal.  ?   Breath sounds: Normal breath sounds. No wheezing or rales.  ?Musculoskeletal:     ?   General: Normal range of motion.  ?   Cervical back: Normal range of motion and neck supple.  ?Lymphadenopathy:  ?   Cervical: Cervical adenopathy present.  ?Skin: ?   General: Skin is warm and dry.  ?Neurological:  ?   Mental Status: She is alert and oriented to person, place, and time.  ?   Motor: No weakness.  ?   Gait: Gait normal.  ?Psychiatric:     ?   Mood and Affect: Mood normal.     ?  Thought Content: Thought content normal.     ?   Judgment: Judgment normal.  ? ? ? ?UC Treatments / Results  ?Labs ?(all labs ordered are listed, but only abnormal results are displayed) ?Labs Reviewed  ?POCT RAPID STREP A (OFFICE) - Abnormal; Notable for the following components:  ?     Result Value  ? Rapid Strep A Screen Positive (*)   ? All other components within normal limits  ? ? ?EKG ? ? ?Radiology ?No results found. ? ?Procedures ?Procedures (including critical care time) ? ?Medications Ordered in UC ?Medications - No data to display ? ?Initial Impression / Assessment and Plan / UC Course  ?I have reviewed the triage vital signs and the nursing notes. ? ?Pertinent labs & imaging results that were available during my care of the patient were reviewed by me and considered in my medical decision making (see chart for details). ? ?  ? ?Rapid strep positive, treat with amoxicillin, viscous lidocaine, supportive over-the-counter medications and home care.  Return for worsening symptoms. ? ?Final Clinical Impressions(s) / UC Diagnoses  ? ?Final diagnoses:  ?Strep tonsillitis  ? ?Discharge Instructions   ?None ?  ? ?ED Prescriptions   ? ? Medication Sig Dispense Auth. Provider  ? amoxicillin (AMOXIL) 875 MG tablet Take 1 tablet (875 mg total) by mouth 2 (two) times daily. 20 tablet Volney American, [place]  ? lidocaine (XYLOCAINE) 2 % solution Use as directed 10 mLs in the mouth or throat every 3 (three) hours as needed for mouth pain. 100 mL Volney American, [place]  ? ?  ? ?PDMP not reviewed this encounter. ?  ?Volney American, PA-C ?08/16/21 1049 ? ?

## 2021-08-16 NOTE — ED Triage Notes (Signed)
Sore throat x 3 days

## 2021-11-18 ENCOUNTER — Encounter: Payer: Self-pay | Admitting: Emergency Medicine

## 2021-11-18 ENCOUNTER — Other Ambulatory Visit: Payer: Self-pay

## 2021-11-18 ENCOUNTER — Ambulatory Visit
Admission: EM | Admit: 2021-11-18 | Discharge: 2021-11-18 | Disposition: A | Discharge status: Home / Self Care | Payer: Medicaid Other | Attending: Family Medicine | Admitting: Family Medicine

## 2021-11-18 DIAGNOSIS — B372 Candidiasis of skin and nail: Secondary | ICD-10-CM

## 2021-11-18 MED ORDER — NYSTATIN 100000 UNIT/GM EX CREA: TOPICAL_CREAM | CUTANEOUS | 1 refills | Status: DC

## 2021-11-18 MED ORDER — NYSTATIN 100000 UNIT/GM EX POWD: 1.0000 | Freq: Two times a day (BID) | CUTANEOUS | 2 refills | Status: DC

## 2021-11-18 NOTE — ED Provider Notes (Signed)
[facility]    CSN: [id number] Arrival date & time: 11/18/21  1402      History   Chief Complaint Chief Complaint  Patient presents with   Rash    HPI Kayla Walker is a 41 y.o. female.   Presenting today with an irritated rash that initially started about a week ago under her right breast and now is under the left breast as well.  She has tried over-the-counter ointments with no relief.  Denies any new soaps, foods, medications or other new exposures.  No past history of similar issues.    Past Medical History:  Diagnosis Date   Gestational diabetes    diet controlled   Medical history non-contributory     Patient Active Problem List   Diagnosis Date Noted   History of gestational diabetes 08/16/2016   Uterine fibroids 07/30/2016   History of cesarean delivery 04/24/2016    Past Surgical History:  Procedure Laterality Date   CESAREAN SECTION     CESAREAN SECTION N/A 10/26/2016   Procedure: CESAREAN SECTION;  Surgeon: Chancy Milroy, MD;  Location: [place];  Service: Obstetrics;  Laterality: N/A;   CESAREAN SECTION N/A 11/05/2020   Procedure: CESAREAN SECTION;  Surgeon: Janyth Pupa, DO;  Location: [facility];  Service: Obstetrics;  Laterality: N/A;    OB History     Gravida  3   Para  3   Term  2   Preterm  1   AB      Living  3      SAB      IAB      Ectopic      Multiple  0   Live Births  3            Home Medications    Prior to Admission medications   Medication Sig Start Date End Date Taking? Authorizing Provider  nystatin (MYCOSTATIN/NYSTOP) powder Apply 1 Application topically 2 (two) times daily. 11/18/21  Yes Volney American, PA-C  nystatin cream (MYCOSTATIN) Apply to affected area 2 times daily 11/18/21  Yes Volney American, PA-C  amoxicillin (AMOXIL) 875 MG tablet Take 1 tablet (875 mg total) by mouth 2 (two) times daily. 08/16/21   Volney American, PA-C  cephALEXin (KEFLEX) 500  MG capsule Take 1 capsule (500 mg total) by mouth 2 (two) times daily. 06/01/21   Jaynee Eagles, PA-C  coconut oil OIL Apply 1 application topically as needed. Patient not taking: No sig reported 11/08/20   Erskine Emery, MD  ferrous sulfate 325 (65 FE) MG tablet Take 1 tablet (325 mg total) by mouth every other day. Patient not taking: Reported on 12/12/2020 11/08/20   Erskine Emery, MD  ibuprofen (ADVIL) 400 MG tablet Take 1 tablet (400 mg total) by mouth every 6 (six) hours as needed. 05/11/21   Noemi Chapel, MD  lidocaine (XYLOCAINE) 2 % solution Use as directed 10 mLs in the mouth or throat every 3 (three) hours as needed for mouth pain. 08/16/21   Volney American, PA-C  Prenatal Vit-Fe Fumarate-FA (MULTIVITAMIN-PRENATAL) 27-0.8 MG TABS tablet Take 1 tablet by mouth daily.     [provider]    Family History Family History  Problem Relation Age of Onset   Autism Daughter    Alcohol abuse Neg Hx    Arthritis Neg Hx    Asthma Neg Hx    Birth defects Neg Hx    Cancer Neg Hx    COPD  Neg Hx    Depression Neg Hx    Diabetes Neg Hx    Drug abuse Neg Hx    Early death Neg Hx    Hearing loss Neg Hx    Hyperlipidemia Neg Hx    Heart disease Neg Hx    Hypertension Neg Hx    Kidney disease Neg Hx    Learning disabilities Neg Hx    Mental illness Neg Hx    Mental retardation Neg Hx    Miscarriages / Stillbirths Neg Hx    Stroke Neg Hx    Vision loss Neg Hx    Varicose Veins Neg Hx     Social History Social History   Tobacco Use   Smoking status: Never   Smokeless tobacco: Never  Vaping Use   Vaping Use: Never used  Substance Use Topics   Alcohol use: No   Drug use: No     Allergies   Patient has no known allergies.   Review of Systems Review of Systems Per HPI  Physical Exam Triage Vital Signs ED Triage Vitals [11/18/21 1443]  Enc Vitals Group     BP (!) 157/96     Pulse Rate 69     Resp 18     Temp 98.5 F (36.9 C)     Temp Source Oral      SpO2 93 %     Weight      Height      Head Circumference      Peak Flow      Pain Score 5     Pain Loc      Pain Edu?      Excl. [place]?    No data found.  Updated Vital Signs BP (!) 157/96 (BP Location: Right Arm)   Pulse 69   Temp 98.5 F (36.9 C) (Oral)   Resp 18   LMP 11/18/2021 (Approximate)   SpO2 93%   Breastfeeding No   Visual Acuity Right Eye Distance:   Left Eye Distance:   Bilateral Distance:    Right Eye Near:   Left Eye Near:    Bilateral Near:     Physical Exam Vitals and nursing note reviewed.  Constitutional:      Appearance: Normal appearance. She is not ill-appearing.  HENT:     Head: Atraumatic.     Mouth/Throat:     Mouth: Mucous membranes are moist.  Eyes:     Extraocular Movements: Extraocular movements intact.     Conjunctiva/sclera: Conjunctivae normal.  Cardiovascular:     Rate and Rhythm: Normal rate and regular rhythm.     Heart sounds: Normal heart sounds.  Pulmonary:     Effort: Pulmonary effort is normal.     Breath sounds: Normal breath sounds.  Musculoskeletal:        General: Normal range of motion.     Cervical back: Normal range of motion and neck supple.  Skin:    General: Skin is warm and dry.     Findings: Rash present.     Comments: Erythematous slightly macerated rash below bilateral breasts  Neurological:     Mental Status: She is alert and oriented to person, place, and time.  Psychiatric:        Mood and Affect: Mood normal.        Thought Content: Thought content normal.        Judgment: Judgment normal.      UC Treatments / Results  Labs (all labs  ordered are listed, but only abnormal results are displayed) Labs Reviewed - No data to display  EKG   Radiology No results found.  Procedures Procedures (including critical care time)  Medications Ordered in UC Medications - No data to display  Initial Impression / Assessment and Plan / UC Course  I have reviewed the triage vital signs and the  nursing notes.  Pertinent labs & imaging results that were available during my care of the patient were reviewed by me and considered in my medical decision making (see chart for details).     Consistent with yeast dermatitis, treat with nystatin cream and powder, keep area clean and dry.  Return for worsening symptoms.  Final Clinical Impressions(s) / UC Diagnoses   Final diagnoses:  Yeast dermatitis   Discharge Instructions   None    ED Prescriptions     Medication Sig Dispense Auth. Provider   nystatin cream (MYCOSTATIN) Apply to affected area 2 times daily 80 g Volney American, PA-C   nystatin (MYCOSTATIN/NYSTOP) powder Apply 1 Application topically 2 (two) times daily. 80 g Volney American, [place]      PDMP not reviewed this encounter.   Volney American, [place] 11/18/21 [phone]

## 2021-11-18 NOTE — ED Triage Notes (Signed)
Pt reports "rash" with burning sensation that started under right breast that is now also under left breast.

## 2022-07-09 ENCOUNTER — Ambulatory Visit
Admission: EM | Admit: 2022-07-09 | Discharge: 2022-07-09 | Disposition: A | Discharge status: Home / Self Care | Payer: Medicaid Other | Attending: Nurse Practitioner | Admitting: Nurse Practitioner

## 2022-07-09 ENCOUNTER — Ambulatory Visit (INDEPENDENT_AMBULATORY_CARE_PROVIDER_SITE_OTHER): Payer: Medicaid Other

## 2022-07-09 DIAGNOSIS — J189 Pneumonia, unspecified organism: Secondary | ICD-10-CM

## 2022-07-09 DIAGNOSIS — R059 Cough, unspecified: Secondary | ICD-10-CM | POA: Diagnosis not present

## 2022-07-09 DIAGNOSIS — R030 Elevated blood-pressure reading, without diagnosis of hypertension: Secondary | ICD-10-CM

## 2022-07-09 MED ORDER — PREDNISONE 20 MG PO TABS: 40.0000 mg | ORAL_TABLET | Freq: Every day | ORAL | 0 refills | Status: AC

## 2022-07-09 MED ORDER — AMOXICILLIN 500 MG PO CAPS: 1000.0000 mg | ORAL_CAPSULE | Freq: Three times a day (TID) | ORAL | 0 refills | Status: AC

## 2022-07-09 MED ORDER — ALBUTEROL SULFATE (2.5 MG/3ML) 0.083% IN NEBU: 2.5000 mg | INHALATION_SOLUTION | RESPIRATORY_TRACT | 0 refills | Status: AC | PRN

## 2022-07-09 MED ORDER — ALBUTEROL SULFATE (2.5 MG/3ML) 0.083% IN NEBU
2.5000 mg | INHALATION_SOLUTION | Freq: Once | RESPIRATORY_TRACT | Status: AC
  Administered 2022-07-09: 2.5 mg via RESPIRATORY_TRACT

## 2022-07-09 NOTE — Discharge Instructions (Signed)
The chest x-ray today shows pneumonia in your right lung Take the amoxicillin as prescribed to treat it Continue albuterol nebulizers every 4-6 hours as needed for wheezing or shortness of breath Start the oral prednisone tomorrow Recommend recheck of chest x-ray in about 6 weeks to ensure full resolution of pneumonia Make sure you are taking deep breaths, drinking plenty of fluids, and Mucinex can help break up congestion, Coricidin products are safe and do not elevate blood pressure  Also recommend establishing care with a primary care provider to discuss elevated blood pressure and for pneumonia recheck

## 2022-07-09 NOTE — ED Provider Notes (Signed)
[facility]    CSN: [id number] Arrival date & time: 07/09/22  1043      History   Chief Complaint Chief Complaint  Patient presents with   Cough    HPI Kayla Walker is a 42 y.o. female.   Patient presents today for 4-day history of congested cough, nasal congestion and runny nose, postnasal drainage and sore throat, vomiting from coughing so hard, and fatigue.  She denies fever, bodyaches or chills, shortness of breath or chest pain, chest congestion, sneezing, headache, ear pain, abdominal pain, nausea, diarrhea, and decreased appetite.  Has tried using home remedies and TheraFlu which seems to help until it wears off.  Reports her 19-year-old child is currently sick with similar symptoms.  Patient denies allergy to antibiotic therapy.  She denies history of asthma or other chronic lung disease.  Denies antibiotic use in the past 90 days.  Patient denies history of hypertension.  No chest pain, shortness of breath, double vision, blurred vision, lightheadedness or dizziness today.    Past Medical History:  Diagnosis Date   Gestational diabetes    diet controlled   Medical history non-contributory     Patient Active Problem List   Diagnosis Date Noted   History of gestational diabetes 08/16/2016   Uterine fibroids 07/30/2016   History of cesarean delivery 04/24/2016    Past Surgical History:  Procedure Laterality Date   CESAREAN SECTION     CESAREAN SECTION N/A 10/26/2016   Procedure: CESAREAN SECTION;  Surgeon: Chancy Milroy, MD;  Location: [place];  Service: Obstetrics;  Laterality: N/A;   CESAREAN SECTION N/A 11/05/2020   Procedure: CESAREAN SECTION;  Surgeon: Janyth Pupa, DO;  Location: [facility];  Service: Obstetrics;  Laterality: N/A;    OB History     Gravida  3   Para  3   Term  2   Preterm  1   AB      Living  3      SAB      IAB      Ectopic      Multiple  0   Live Births  3            Home  Medications    Prior to Admission medications   Medication Sig Start Date End Date Taking? Authorizing Provider  albuterol (PROVENTIL) (2.5 MG/3ML) 0.083% nebulizer solution Take 3 mLs (2.5 mg total) by nebulization every 4 (four) hours as needed for wheezing or shortness of breath. 07/09/22  Yes Eulogio Bear, NP  amoxicillin (AMOXIL) 500 MG capsule Take 2 capsules (1,000 mg total) by mouth 3 (three) times daily for 5 days. 07/09/22 07/14/22 Yes Eulogio Bear, NP  ibuprofen (ADVIL) 400 MG tablet Take 1 tablet (400 mg total) by mouth every 6 (six) hours as needed. 05/11/21  Yes Noemi Chapel, MD  predniSONE (DELTASONE) 20 MG tablet Take 2 tablets (40 mg total) by mouth daily with breakfast for 5 days. 07/09/22 07/14/22 Yes Eulogio Bear, NP  coconut oil OIL Apply 1 application topically as needed. Patient not taking: Reported on 11/14/2020 11/08/20   Erskine Emery, MD  ferrous sulfate 325 (65 FE) MG tablet Take 1 tablet (325 mg total) by mouth every other day. Patient not taking: Reported on 12/12/2020 11/08/20   Erskine Emery, MD  Prenatal Vit-Fe Fumarate-FA (MULTIVITAMIN-PRENATAL) 27-0.8 MG TABS tablet Take 1 tablet by mouth daily.     [provider]    Family History Family History  Problem Relation Age of Onset   Autism Daughter    Alcohol abuse Neg Hx    Arthritis Neg Hx    Asthma Neg Hx    Birth defects Neg Hx    Cancer Neg Hx    COPD Neg Hx    Depression Neg Hx    Diabetes Neg Hx    Drug abuse Neg Hx    Early death Neg Hx    Hearing loss Neg Hx    Hyperlipidemia Neg Hx    Heart disease Neg Hx    Hypertension Neg Hx    Kidney disease Neg Hx    Learning disabilities Neg Hx    Mental illness Neg Hx    Mental retardation Neg Hx    Miscarriages / Stillbirths Neg Hx    Stroke Neg Hx    Vision loss Neg Hx    Varicose Veins Neg Hx     Social History Social History   Tobacco Use   Smoking status: Never   Smokeless tobacco: Never  Vaping Use   Vaping  Use: Never used  Substance Use Topics   Alcohol use: No   Drug use: No     Allergies   Patient has no known allergies.   Review of Systems Review of Systems Per HPI  Physical Exam Triage Vital Signs ED Triage Vitals  Enc Vitals Group     BP 07/09/22 1246 (!) 162/95     Pulse Rate 07/09/22 1246 73     Resp 07/09/22 1246 18     Temp 07/09/22 1246 98.7 F (37.1 C)     Temp Source 07/09/22 1246 Oral     SpO2 07/09/22 1246 96 %     Weight --      Height --      Head Circumference --      Peak Flow --      Pain Score 07/09/22 1247 0     Pain Loc --      Pain Edu? --      Excl. in [place]? --    No data found.  Updated Vital Signs BP (!) 156/98 (BP Location: Right Arm)   Pulse 73   Temp 98.7 F (37.1 C) (Oral)   Resp 18   LMP 07/08/2022 (Exact Date)   SpO2 96%   Visual Acuity Right Eye Distance:   Left Eye Distance:   Bilateral Distance:    Right Eye Near:   Left Eye Near:    Bilateral Near:     Physical Exam Vitals and nursing note reviewed.  Constitutional:      General: She is not in acute distress.    Appearance: Normal appearance. She is not ill-appearing or toxic-appearing.  HENT:     Head: Normocephalic and atraumatic.     Right Ear: Tympanic membrane, ear canal and external ear normal.     Left Ear: Tympanic membrane, ear canal and external ear normal.     Nose: No congestion or rhinorrhea.     Mouth/Throat:     Mouth: Mucous membranes are moist.     Pharynx: Oropharynx is clear. No oropharyngeal exudate or posterior oropharyngeal erythema.  Eyes:     General: No scleral icterus.    Extraocular Movements: Extraocular movements intact.  Cardiovascular:     Rate and Rhythm: Normal rate and regular rhythm.  Pulmonary:     Effort: Pulmonary effort is normal. No respiratory distress.     Breath sounds: Rhonchi present. No wheezing or rales.  Abdominal:     General: Abdomen is flat. Bowel sounds are normal. There is no distension.     Palpations:  Abdomen is soft.     Tenderness: There is no abdominal tenderness. There is no guarding.  Musculoskeletal:     Cervical back: Normal range of motion and neck supple.  Lymphadenopathy:     Cervical: No cervical adenopathy.  Skin:    General: Skin is warm and dry.     Coloration: Skin is not jaundiced or pale.     Findings: No erythema or rash.  Neurological:     Mental Status: She is alert and oriented to person, place, and time.  Psychiatric:        Behavior: Behavior is cooperative.      UC Treatments / Results  Labs (all labs ordered are listed, but only abnormal results are displayed) Labs Reviewed - No data to display  EKG   Radiology DG Chest 2 View  Result Date: 07/09/2022 CLINICAL DATA:  Cough EXAM: CHEST - 2 VIEW COMPARISON:  None Available. FINDINGS: Cardiac size is within normal limits. Small patchy infiltrate is seen in anterior right lower lung field. There is peribronchial thickening. There is no pleural effusion or pneumothorax. IMPRESSION: Small patchy infiltrate is seen in right middle lobe suggesting pneumonia. Peribronchial thickening suggests bronchitis. Electronically Signed   By: Elmer Picker M.D.   On: 07/09/2022 13:17    Procedures Procedures (including critical care time)  Medications Ordered in UC Medications  albuterol (PROVENTIL) (2.5 MG/3ML) 0.083% nebulizer solution 2.5 mg (2.5 mg Nebulization Given 07/09/22 1315)    Initial Impression / Assessment and Plan / UC Course  I have reviewed the triage vital signs and the nursing notes.  Pertinent labs & imaging results that were available during my care of the patient were reviewed by me and considered in my medical decision making (see chart for details).   Patient is well-appearing, normotensive, afebrile, not tachycardic, not tachypneic, oxygenating well on room air.    1. Pneumonia of right middle lobe due to infectious organism Chest x-ray today shows right middle lobe pneumonia as  well as peribronchial thickening Albuterol breathing treatment given with relief of chest tightness; lung sounds improved after, however rhonchorous sounds heard bilaterally Will treat with amoxicillin 1 g 3 times daily for 5 days-no high risk history Other supportive care discussed Start albuterol inhaler at home every 4-6 hours as needed for wheezing or shortness of breath Also start oral prednisone daily for 5 days Recommended close follow-up within 6 weeks for reevaluation to ensure full resolution of pneumonia  2. Elevated blood pressure reading in office without diagnosis of hypertension Blood pressure remained elevated on recheck No red flags in history or on exam today Recommended following up with primary care provider for further evaluation and management Primary care assistance initiated today  The patient was given the opportunity to ask questions.  All questions answered to their satisfaction.  The patient is in agreement to this plan.    Final Clinical Impressions(s) / UC Diagnoses   Final diagnoses:  Pneumonia of right middle lobe due to infectious organism  Elevated blood pressure reading in office without diagnosis of hypertension     Discharge Instructions      The chest x-ray today shows pneumonia in your right lung Take the amoxicillin as prescribed to treat it Continue albuterol nebulizers every 4-6 hours as needed for wheezing or shortness of breath Start the oral prednisone tomorrow Recommend recheck of chest  x-ray in about 6 weeks to ensure full resolution of pneumonia Make sure you are taking deep breaths, drinking plenty of fluids, and Mucinex can help break up congestion, Coricidin products are safe and do not elevate blood pressure  Also recommend establishing care with a primary care provider to discuss elevated blood pressure and for pneumonia recheck     ED Prescriptions     Medication Sig Dispense Auth. Provider   amoxicillin (AMOXIL) 500 MG  capsule Take 2 capsules (1,000 mg total) by mouth 3 (three) times daily for 5 days. 30 capsule Noemi Chapel A, NP   predniSONE (DELTASONE) 20 MG tablet Take 2 tablets (40 mg total) by mouth daily with breakfast for 5 days. 10 tablet Noemi Chapel A, NP   albuterol (PROVENTIL) (2.5 MG/3ML) 0.083% nebulizer solution Take 3 mLs (2.5 mg total) by nebulization every 4 (four) hours as needed for wheezing or shortness of breath. 75 mL Eulogio Bear, NP      PDMP not reviewed this encounter.   Eulogio Bear, NP 07/09/22 [phone]

## 2022-07-09 NOTE — ED Triage Notes (Signed)
Cough, runny nose, congestion, that started 4 days ago. Tried taking cough syrup and home remedies with no relief of symptoms.

## 2022-09-17 ENCOUNTER — Ambulatory Visit
Admission: EM | Admit: 2022-09-17 | Discharge: 2022-09-17 | Disposition: A | Discharge status: Home / Self Care | Payer: BC Managed Care – PPO | Attending: Nurse Practitioner | Admitting: Nurse Practitioner

## 2022-09-17 DIAGNOSIS — N760 Acute vaginitis: Secondary | ICD-10-CM | POA: Diagnosis not present

## 2022-09-17 DIAGNOSIS — Z113 Encounter for screening for infections with a predominantly sexual mode of transmission: Secondary | ICD-10-CM | POA: Insufficient documentation

## 2022-09-17 DIAGNOSIS — N3001 Acute cystitis with hematuria: Secondary | ICD-10-CM | POA: Insufficient documentation

## 2022-09-17 DIAGNOSIS — L292 Pruritus vulvae: Secondary | ICD-10-CM | POA: Diagnosis not present

## 2022-09-17 DIAGNOSIS — B3731 Acute candidiasis of vulva and vagina: Secondary | ICD-10-CM | POA: Insufficient documentation

## 2022-09-17 DIAGNOSIS — I10 Essential (primary) hypertension: Secondary | ICD-10-CM | POA: Diagnosis not present

## 2022-09-17 LAB — POCT URINALYSIS DIP (MANUAL ENTRY)
Bilirubin, UA: NEGATIVE
Glucose, UA: NEGATIVE mg/dL
Ketones, POC UA: NEGATIVE mg/dL
Nitrite, UA: NEGATIVE
Spec Grav, UA: 1.01 (ref 1.010–1.025)
Urobilinogen, UA: 0.2 E.U./dL
pH, UA: 7 (ref 5.0–8.0)

## 2022-09-17 MED ORDER — CEPHALEXIN 500 MG PO CAPS: 500.0000 mg | ORAL_CAPSULE | Freq: Two times a day (BID) | ORAL | 0 refills | Status: AC

## 2022-09-17 MED ORDER — LABETALOL HCL 100 MG PO TABS: 100.0000 mg | ORAL_TABLET | Freq: Two times a day (BID) | ORAL | 0 refills | Status: AC

## 2022-09-17 NOTE — Discharge Instructions (Signed)
The urinalysis today is suggestive of a urinary tract infection.  Please take the Keflex as prescribed to treat it.  We are sending for a urine culture and will contact you later this week if we need to change the antibiotic.  We are testing the vaginal swab today and we will contact you if anything comes back showing that we need to treat.  Your blood pressure is elevated today.  Please start the labetalol and take as prescribed until you are able to follow-up with a primary care provider to help lower blood pressure.

## 2022-09-17 NOTE — ED Triage Notes (Signed)
Pt reports she had small amounts of blood in her urine, itchiness, painful urination, low bak pain, and frequent urination x 3 days.    Pt does not have a PCP.

## 2022-09-18 LAB — CERVICOVAGINAL ANCILLARY ONLY
Bacterial Vaginitis (gardnerella): NEGATIVE
Candida Glabrata: NEGATIVE
Candida Vaginitis: POSITIVE — AB
Chlamydia: NEGATIVE
Comment: NEGATIVE
Comment: NEGATIVE
Comment: NEGATIVE
Comment: NEGATIVE
Comment: NEGATIVE
Comment: NORMAL
Neisseria Gonorrhea: NEGATIVE
Trichomonas: NEGATIVE

## 2022-09-18 NOTE — ED Provider Notes (Signed)
[facility]    CSN: [id number] Arrival date & time: 09/17/22  1500      History   Chief Complaint No chief complaint on file.   HPI Kayla Walker is a 42 y.o. female.   Patient presents today for 3-day history of burning with urination, increased urinary frequency and urgency, voiding smaller amounts, midline low back pain, vaginal itchiness, and hematuria.  She also endorses vaginal discharge that she describes as clumpy.  No vaginal odor or concern for STI today.  No fever, nausea/vomiting, lower abdominal or pelvic pain.  No flank pain.  Patient reports history of similar approximately 1 year ago.  She was treated with Keflex at that time with full resolution of symptoms.  Patient is confident she is not pregnant.  Reports she is not planning on having more children, however is married and is currently sexually active.  They are not using any pregnancy prevention.  Blood pressure is noted to be elevated today.  Patient was supposed to establish care with primary care provider after last visit, however this was not performed secondary to losing her insurance.  Reports she had now has insurance and is requesting referral to PCP today.  Patient denies chest pain, shortness of breath, vision changes, lightheadedness/dizziness.  She has never taken anything for blood pressure before.  She does have a history of gestational diabetes that was diet-controlled.    Past Medical History:  Diagnosis Date   Gestational diabetes    diet controlled   Medical history non-contributory     Patient Active Problem List   Diagnosis Date Noted   History of gestational diabetes 08/16/2016   Uterine fibroids 07/30/2016   History of cesarean delivery 04/24/2016    Past Surgical History:  Procedure Laterality Date   CESAREAN SECTION     CESAREAN SECTION N/A 10/26/2016   Procedure: CESAREAN SECTION;  Surgeon: Hermina Staggers, MD;  Location: [facility];  Service: Obstetrics;   Laterality: N/A;   CESAREAN SECTION N/A 11/05/2020   Procedure: CESAREAN SECTION;  Surgeon: Myna Hidalgo, DO;  Location: [facility] LD ORS;  Service: Obstetrics;  Laterality: N/A;    OB History     Gravida  3   Para  3   Term  2   Preterm  1   AB      Living  3      SAB      IAB      Ectopic      Multiple  0   Live Births  3            Home Medications    Prior to Admission medications   Medication Sig Start Date End Date Taking? Authorizing Provider  cephALEXin (KEFLEX) 500 MG capsule Take 1 capsule (500 mg total) by mouth 2 (two) times daily for 5 days. 09/17/22 09/22/22 Yes Valentino Nose, NP  labetalol (NORMODYNE) 100 MG tablet Take 1 tablet (100 mg total) by mouth 2 (two) times daily. 09/17/22  Yes Cathlean Marseilles A, NP  albuterol (PROVENTIL) (2.5 MG/3ML) 0.083% nebulizer solution Take 3 mLs (2.5 mg total) by nebulization every 4 (four) hours as needed for wheezing or shortness of breath. 07/09/22   Valentino Nose, NP  coconut oil OIL Apply 1 application topically as needed. Patient not taking: Reported on 11/14/2020 11/08/20   Alfredo Vieno Tarrant, MD  ferrous sulfate 325 (65 FE) MG tablet Take 1 tablet (325 mg total) by mouth every other day. Patient not taking: Reported  on 12/12/2020 11/08/20   Alfredo Alayne Estrella, MD  ibuprofen (ADVIL) 400 MG tablet Take 1 tablet (400 mg total) by mouth every 6 (six) hours as needed. 05/11/21   Eber Hong, MD  Prenatal Vit-Fe Fumarate-FA (MULTIVITAMIN-PRENATAL) 27-0.8 MG TABS tablet Take 1 tablet by mouth daily.     [provider]    Family History Family History  Problem Relation Age of Onset   Autism Daughter    Alcohol abuse Neg Hx    Arthritis Neg Hx    Asthma Neg Hx    Birth defects Neg Hx    Cancer Neg Hx    COPD Neg Hx    Depression Neg Hx    Diabetes Neg Hx    Drug abuse Neg Hx    Early death Neg Hx    Hearing loss Neg Hx    Hyperlipidemia Neg Hx    Heart disease Neg Hx    Hypertension Neg Hx     Kidney disease Neg Hx    Learning disabilities Neg Hx    Mental illness Neg Hx    Mental retardation Neg Hx    Miscarriages / Stillbirths Neg Hx    Stroke Neg Hx    Vision loss Neg Hx    Varicose Veins Neg Hx     Social History Social History   Tobacco Use   Smoking status: Never   Smokeless tobacco: Never  Vaping Use   Vaping Use: Never used  Substance Use Topics   Alcohol use: No   Drug use: No     Allergies   Patient has no known allergies.   Review of Systems Review of Systems Per HPI  Physical Exam Triage Vital Signs ED Triage Vitals  Enc Vitals Group     BP 09/17/22 1627 (!) 163/110     Pulse Rate 09/17/22 1627 81     Resp 09/17/22 1627 18     Temp 09/17/22 1627 98.1 F (36.7 C)     Temp Source 09/17/22 1627 Oral     SpO2 09/17/22 1627 96 %     Weight --      Height --      Head Circumference --      Peak Flow --      Pain Score 09/17/22 1626 0     Pain Loc --      Pain Edu? --      Excl. in GC? --    No data found.  Updated Vital Signs BP (!) 163/110 (BP Location: Right Arm)   Pulse 81   Temp 98.1 F (36.7 C) (Oral)   Resp 18   LMP 09/11/2022 (Approximate)   SpO2 96%   Visual Acuity Right Eye Distance:   Left Eye Distance:   Bilateral Distance:    Right Eye Near:   Left Eye Near:    Bilateral Near:     Physical Exam Vitals and nursing note reviewed.  Constitutional:      General: She is not in acute distress.    Appearance: She is not toxic-appearing.  HENT:     Head: Normocephalic and atraumatic.     Right Ear: External ear normal.     Left Ear: External ear normal.     Mouth/Throat:     Mouth: Mucous membranes are moist.     Pharynx: Oropharynx is clear.  Eyes:     General:        Right eye: No discharge.  Left eye: No discharge.     Extraocular Movements: Extraocular movements intact.  Cardiovascular:     Rate and Rhythm: Normal rate and regular rhythm.  Pulmonary:     Effort: Pulmonary effort is normal. No  respiratory distress.     Breath sounds: Normal breath sounds. No wheezing, rhonchi or rales.  Abdominal:     General: Abdomen is flat. Bowel sounds are normal. There is no distension.     Palpations: Abdomen is soft. There is no mass.     Tenderness: There is no abdominal tenderness. There is no right CVA tenderness, left CVA tenderness or guarding.  Musculoskeletal:     Cervical back: Normal range of motion.     Right lower leg: No edema.     Left lower leg: No edema.  Lymphadenopathy:     Cervical: No cervical adenopathy.  Skin:    General: Skin is warm and dry.     Coloration: Skin is not jaundiced or pale.     Findings: No erythema.  Neurological:     Mental Status: She is alert and oriented to person, place, and time.     Motor: No weakness.     Gait: Gait normal.  Psychiatric:        Behavior: Behavior is cooperative.      UC Treatments / Results  Labs (all labs ordered are listed, but only abnormal results are displayed) Labs Reviewed  POCT URINALYSIS DIP (MANUAL ENTRY) - Abnormal; Notable for the following components:      Result Value   Clarity, UA cloudy (*)    Blood, UA large (*)    Protein Ur, POC trace (*)    Leukocytes, UA Large (3+) (*)    All other components within normal limits  URINE CULTURE  CERVICOVAGINAL ANCILLARY ONLY    EKG   Radiology No results found.  Procedures Procedures (including critical care time)  Medications Ordered in UC Medications - No data to display  Initial Impression / Assessment and Plan / UC Course  I have reviewed the triage vital signs and the nursing notes.  Pertinent labs & imaging results that were available during my care of the patient were reviewed by me and considered in my medical decision making (see chart for details).   Patient is well-appearing, afebrile, not tachycardic, not tachypneic, oxygenating well on room air.  The patient is hypertensive in triage today.  The last few visits with urgent care,  her blood pressure was elevated above 140/90.  1. Acute vaginitis Suspect yeast vaginitis Cytology is pending-will defer treatment for now especially since we are starting antibiotics for UTI Treat as indicated  2. Acute cystitis with hematuria Urinalysis today is cloudy with large amount of blood, trace protein, 3+ leukocyte esterase Suspect acute cystitis, protein likely secondary to uncontrolled hypertension Treat cystitis today with Keflex 500 mg twice daily for 5 days Urine culture is pending  3. Essential hypertension Last menstrual period is within the past 30 days, however patient is not using pregnancy prevention Treat with labetalol 100 mg twice daily PCP assistance initiated today  The patient was given the opportunity to ask questions.  All questions answered to their satisfaction.  The patient is in agreement to this plan.    Final Clinical Impressions(s) / UC Diagnoses   Final diagnoses:  Acute vaginitis  Acute cystitis with hematuria  Essential hypertension     Discharge Instructions      The urinalysis today is suggestive of a urinary tract  infection.  Please take the Keflex as prescribed to treat it.  We are sending for a urine culture and will contact you later this week if we need to change the antibiotic.  We are testing the vaginal swab today and we will contact you if anything comes back showing that we need to treat.  Your blood pressure is elevated today.  Please start the labetalol and take as prescribed until you are able to follow-up with a primary care provider to help lower blood pressure.    ED Prescriptions     Medication Sig Dispense Auth. Provider   labetalol (NORMODYNE) 100 MG tablet Take 1 tablet (100 mg total) by mouth 2 (two) times daily. 30 tablet Cathlean Marseilles A, NP   cephALEXin (KEFLEX) 500 MG capsule Take 1 capsule (500 mg total) by mouth 2 (two) times daily for 5 days. 10 capsule Valentino Nose, NP      PDMP not  reviewed this encounter.   Valentino Nose, NP 09/18/22 [phone]

## 2022-09-19 ENCOUNTER — Telehealth (HOSPITAL_COMMUNITY): Payer: Self-pay | Admitting: Emergency Medicine

## 2022-09-19 MED ORDER — FLUCONAZOLE 150 MG PO TABS: 150.0000 mg | ORAL_TABLET | Freq: Once | ORAL | 0 refills | Status: AC

## 2022-09-20 LAB — URINE CULTURE: Culture: 20000 — AB

## 2022-09-21 ENCOUNTER — Encounter (HOSPITAL_COMMUNITY): Payer: Self-pay

## 2022-09-24 DIAGNOSIS — Z6831 Body mass index (BMI) 31.0-31.9, adult: Secondary | ICD-10-CM | POA: Diagnosis not present

## 2022-09-24 DIAGNOSIS — I10 Essential (primary) hypertension: Secondary | ICD-10-CM | POA: Diagnosis not present

## 2022-09-24 DIAGNOSIS — Z131 Encounter for screening for diabetes mellitus: Secondary | ICD-10-CM | POA: Diagnosis not present

## 2022-09-24 DIAGNOSIS — E079 Disorder of thyroid, unspecified: Secondary | ICD-10-CM | POA: Diagnosis not present

## 2022-09-24 DIAGNOSIS — Z0001 Encounter for general adult medical examination with abnormal findings: Secondary | ICD-10-CM | POA: Diagnosis not present

## 2022-10-01 DIAGNOSIS — Z6831 Body mass index (BMI) 31.0-31.9, adult: Secondary | ICD-10-CM | POA: Diagnosis not present

## 2022-10-01 DIAGNOSIS — Z0001 Encounter for general adult medical examination with abnormal findings: Secondary | ICD-10-CM | POA: Diagnosis not present

## 2022-10-01 DIAGNOSIS — I10 Essential (primary) hypertension: Secondary | ICD-10-CM | POA: Diagnosis not present

## 2023-01-01 DIAGNOSIS — I10 Essential (primary) hypertension: Secondary | ICD-10-CM | POA: Diagnosis not present

## 2023-01-01 DIAGNOSIS — R7303 Prediabetes: Secondary | ICD-10-CM | POA: Diagnosis not present

## 2023-01-01 DIAGNOSIS — Z6829 Body mass index (BMI) 29.0-29.9, adult: Secondary | ICD-10-CM | POA: Diagnosis not present

## 2023-05-10 DIAGNOSIS — R7303 Prediabetes: Secondary | ICD-10-CM | POA: Diagnosis not present

## 2023-05-10 DIAGNOSIS — Z6831 Body mass index (BMI) 31.0-31.9, adult: Secondary | ICD-10-CM | POA: Diagnosis not present

## 2023-05-10 DIAGNOSIS — I10 Essential (primary) hypertension: Secondary | ICD-10-CM | POA: Diagnosis not present

## 2023-11-11 DIAGNOSIS — Z6831 Body mass index (BMI) 31.0-31.9, adult: Secondary | ICD-10-CM | POA: Diagnosis not present

## 2023-11-11 DIAGNOSIS — I10 Essential (primary) hypertension: Secondary | ICD-10-CM | POA: Diagnosis not present

## 2023-11-11 DIAGNOSIS — Z0001 Encounter for general adult medical examination with abnormal findings: Secondary | ICD-10-CM | POA: Diagnosis not present

## 2023-11-11 DIAGNOSIS — R7303 Prediabetes: Secondary | ICD-10-CM | POA: Diagnosis not present

## 2024-02-26 DIAGNOSIS — Z6831 Body mass index (BMI) 31.0-31.9, adult: Secondary | ICD-10-CM | POA: Diagnosis not present

## 2024-02-26 DIAGNOSIS — I10 Essential (primary) hypertension: Secondary | ICD-10-CM | POA: Diagnosis not present

## 2024-02-26 DIAGNOSIS — R7303 Prediabetes: Secondary | ICD-10-CM | POA: Diagnosis not present
